# Patient Record
Sex: Male | Born: 1935 | Race: White | Hispanic: No | Smoking: Former smoker
Health system: Southern US, Community
[De-identification: ages and names within clinical notes are randomized; demographics above are authoritative.]

## PROBLEM LIST (undated history)

## (undated) DIAGNOSIS — D649 Anemia, unspecified: Secondary | ICD-10-CM

## (undated) DIAGNOSIS — R32 Unspecified urinary incontinence: Secondary | ICD-10-CM

## (undated) DIAGNOSIS — R350 Frequency of micturition: Secondary | ICD-10-CM

## (undated) DIAGNOSIS — K219 Gastro-esophageal reflux disease without esophagitis: Secondary | ICD-10-CM

## (undated) DIAGNOSIS — I10 Essential (primary) hypertension: Secondary | ICD-10-CM

## (undated) DIAGNOSIS — E785 Hyperlipidemia, unspecified: Secondary | ICD-10-CM

## (undated) DIAGNOSIS — R972 Elevated prostate specific antigen [PSA]: Secondary | ICD-10-CM

## (undated) DIAGNOSIS — I509 Heart failure, unspecified: Secondary | ICD-10-CM

## (undated) DIAGNOSIS — M1990 Unspecified osteoarthritis, unspecified site: Secondary | ICD-10-CM

## (undated) DIAGNOSIS — E119 Type 2 diabetes mellitus without complications: Secondary | ICD-10-CM

## (undated) DIAGNOSIS — N189 Chronic kidney disease, unspecified: Secondary | ICD-10-CM

## (undated) DIAGNOSIS — J449 Chronic obstructive pulmonary disease, unspecified: Secondary | ICD-10-CM

## (undated) DIAGNOSIS — R339 Retention of urine, unspecified: Secondary | ICD-10-CM

## (undated) DIAGNOSIS — K5790 Diverticulosis of intestine, part unspecified, without perforation or abscess without bleeding: Secondary | ICD-10-CM

## (undated) DIAGNOSIS — Z8719 Personal history of other diseases of the digestive system: Secondary | ICD-10-CM

## (undated) DIAGNOSIS — C801 Malignant (primary) neoplasm, unspecified: Secondary | ICD-10-CM

## (undated) HISTORY — PX: OTHER SURGICAL HISTORY: SHX169

## (undated) HISTORY — DX: Retention of urine, unspecified: R33.9

## (undated) HISTORY — DX: Frequency of micturition: R35.0

## (undated) HISTORY — PX: EYE SURGERY: SHX253

## (undated) HISTORY — DX: Unspecified urinary incontinence: R32

## (undated) HISTORY — DX: Hyperlipidemia, unspecified: E78.5

## (undated) HISTORY — PX: JOINT REPLACEMENT: SHX530

## (undated) HISTORY — PX: SEPTOPLASTY: SUR1290

## (undated) HISTORY — DX: Heart failure, unspecified: I50.9

## (undated) HISTORY — PX: HAMMER TOE SURGERY: SHX385

## (undated) HISTORY — DX: Elevated prostate specific antigen (PSA): R97.20

---

## 1970-05-17 HISTORY — PX: BACK SURGERY: SHX140

## 1978-05-17 HISTORY — PX: HERNIA REPAIR: SHX51

## 1993-05-17 HISTORY — PX: PROSTATECTOMY: SHX69

## 1998-05-17 HISTORY — PX: CHOLECYSTECTOMY: SHX55

## 2011-06-16 DIAGNOSIS — C61 Malignant neoplasm of prostate: Secondary | ICD-10-CM | POA: Diagnosis not present

## 2011-06-16 DIAGNOSIS — R972 Elevated prostate specific antigen [PSA]: Secondary | ICD-10-CM | POA: Diagnosis not present

## 2011-06-23 DIAGNOSIS — C61 Malignant neoplasm of prostate: Secondary | ICD-10-CM | POA: Diagnosis not present

## 2011-07-05 DIAGNOSIS — M779 Enthesopathy, unspecified: Secondary | ICD-10-CM | POA: Diagnosis not present

## 2011-07-29 DIAGNOSIS — M659 Synovitis and tenosynovitis, unspecified: Secondary | ICD-10-CM | POA: Diagnosis not present

## 2011-07-29 DIAGNOSIS — M79609 Pain in unspecified limb: Secondary | ICD-10-CM | POA: Diagnosis not present

## 2011-07-29 DIAGNOSIS — M205X9 Other deformities of toe(s) (acquired), unspecified foot: Secondary | ICD-10-CM | POA: Diagnosis not present

## 2011-07-29 DIAGNOSIS — M2040 Other hammer toe(s) (acquired), unspecified foot: Secondary | ICD-10-CM | POA: Diagnosis not present

## 2011-09-01 DIAGNOSIS — L989 Disorder of the skin and subcutaneous tissue, unspecified: Secondary | ICD-10-CM | POA: Diagnosis not present

## 2011-09-01 DIAGNOSIS — D485 Neoplasm of uncertain behavior of skin: Secondary | ICD-10-CM | POA: Diagnosis not present

## 2011-10-04 DIAGNOSIS — L03119 Cellulitis of unspecified part of limb: Secondary | ICD-10-CM | POA: Diagnosis not present

## 2011-10-04 DIAGNOSIS — M898X9 Other specified disorders of bone, unspecified site: Secondary | ICD-10-CM | POA: Diagnosis not present

## 2011-10-04 DIAGNOSIS — L97509 Non-pressure chronic ulcer of other part of unspecified foot with unspecified severity: Secondary | ICD-10-CM | POA: Diagnosis not present

## 2011-10-04 DIAGNOSIS — L02619 Cutaneous abscess of unspecified foot: Secondary | ICD-10-CM | POA: Diagnosis not present

## 2011-10-07 DIAGNOSIS — E7800 Pure hypercholesterolemia, unspecified: Secondary | ICD-10-CM | POA: Diagnosis not present

## 2011-10-07 DIAGNOSIS — I10 Essential (primary) hypertension: Secondary | ICD-10-CM | POA: Diagnosis not present

## 2011-10-07 DIAGNOSIS — E119 Type 2 diabetes mellitus without complications: Secondary | ICD-10-CM | POA: Diagnosis not present

## 2011-10-12 DIAGNOSIS — L97509 Non-pressure chronic ulcer of other part of unspecified foot with unspecified severity: Secondary | ICD-10-CM | POA: Diagnosis not present

## 2011-10-18 DIAGNOSIS — E119 Type 2 diabetes mellitus without complications: Secondary | ICD-10-CM | POA: Diagnosis not present

## 2011-10-18 DIAGNOSIS — I739 Peripheral vascular disease, unspecified: Secondary | ICD-10-CM | POA: Diagnosis not present

## 2011-10-18 DIAGNOSIS — E7800 Pure hypercholesterolemia, unspecified: Secondary | ICD-10-CM | POA: Diagnosis not present

## 2011-10-18 DIAGNOSIS — I10 Essential (primary) hypertension: Secondary | ICD-10-CM | POA: Diagnosis not present

## 2011-10-25 DIAGNOSIS — I774 Celiac artery compression syndrome: Secondary | ICD-10-CM | POA: Diagnosis not present

## 2011-10-25 DIAGNOSIS — I739 Peripheral vascular disease, unspecified: Secondary | ICD-10-CM | POA: Diagnosis not present

## 2011-10-28 DIAGNOSIS — I2510 Atherosclerotic heart disease of native coronary artery without angina pectoris: Secondary | ICD-10-CM | POA: Diagnosis not present

## 2011-11-02 DIAGNOSIS — M898X9 Other specified disorders of bone, unspecified site: Secondary | ICD-10-CM | POA: Diagnosis not present

## 2011-11-02 DIAGNOSIS — E119 Type 2 diabetes mellitus without complications: Secondary | ICD-10-CM | POA: Diagnosis not present

## 2011-11-02 DIAGNOSIS — I2510 Atherosclerotic heart disease of native coronary artery without angina pectoris: Secondary | ICD-10-CM | POA: Diagnosis not present

## 2011-11-05 DIAGNOSIS — E119 Type 2 diabetes mellitus without complications: Secondary | ICD-10-CM | POA: Diagnosis not present

## 2011-11-05 DIAGNOSIS — I10 Essential (primary) hypertension: Secondary | ICD-10-CM | POA: Diagnosis not present

## 2011-11-05 DIAGNOSIS — E785 Hyperlipidemia, unspecified: Secondary | ICD-10-CM | POA: Diagnosis not present

## 2011-11-05 DIAGNOSIS — M898X9 Other specified disorders of bone, unspecified site: Secondary | ICD-10-CM | POA: Diagnosis not present

## 2011-11-05 DIAGNOSIS — M2040 Other hammer toe(s) (acquired), unspecified foot: Secondary | ICD-10-CM | POA: Diagnosis not present

## 2011-11-10 DIAGNOSIS — M2040 Other hammer toe(s) (acquired), unspecified foot: Secondary | ICD-10-CM | POA: Diagnosis not present

## 2011-12-14 DIAGNOSIS — M898X9 Other specified disorders of bone, unspecified site: Secondary | ICD-10-CM | POA: Diagnosis not present

## 2011-12-21 DIAGNOSIS — I10 Essential (primary) hypertension: Secondary | ICD-10-CM | POA: Diagnosis not present

## 2011-12-21 DIAGNOSIS — I2510 Atherosclerotic heart disease of native coronary artery without angina pectoris: Secondary | ICD-10-CM | POA: Diagnosis not present

## 2012-01-04 DIAGNOSIS — C61 Malignant neoplasm of prostate: Secondary | ICD-10-CM | POA: Diagnosis not present

## 2012-01-11 DIAGNOSIS — C61 Malignant neoplasm of prostate: Secondary | ICD-10-CM | POA: Diagnosis not present

## 2012-02-18 DIAGNOSIS — Z23 Encounter for immunization: Secondary | ICD-10-CM | POA: Diagnosis not present

## 2012-03-13 DIAGNOSIS — H35379 Puckering of macula, unspecified eye: Secondary | ICD-10-CM | POA: Diagnosis not present

## 2012-03-13 DIAGNOSIS — E1139 Type 2 diabetes mellitus with other diabetic ophthalmic complication: Secondary | ICD-10-CM | POA: Diagnosis not present

## 2012-03-13 DIAGNOSIS — M479 Spondylosis, unspecified: Secondary | ICD-10-CM | POA: Diagnosis not present

## 2012-03-13 DIAGNOSIS — E119 Type 2 diabetes mellitus without complications: Secondary | ICD-10-CM | POA: Diagnosis not present

## 2012-03-13 DIAGNOSIS — E11329 Type 2 diabetes mellitus with mild nonproliferative diabetic retinopathy without macular edema: Secondary | ICD-10-CM | POA: Diagnosis not present

## 2012-03-13 DIAGNOSIS — M25559 Pain in unspecified hip: Secondary | ICD-10-CM | POA: Diagnosis not present

## 2012-04-17 DIAGNOSIS — E119 Type 2 diabetes mellitus without complications: Secondary | ICD-10-CM | POA: Diagnosis not present

## 2012-04-17 DIAGNOSIS — E7800 Pure hypercholesterolemia, unspecified: Secondary | ICD-10-CM | POA: Diagnosis not present

## 2012-04-17 DIAGNOSIS — I10 Essential (primary) hypertension: Secondary | ICD-10-CM | POA: Diagnosis not present

## 2012-04-24 DIAGNOSIS — I10 Essential (primary) hypertension: Secondary | ICD-10-CM | POA: Diagnosis not present

## 2012-04-24 DIAGNOSIS — C61 Malignant neoplasm of prostate: Secondary | ICD-10-CM | POA: Diagnosis not present

## 2012-04-24 DIAGNOSIS — E7800 Pure hypercholesterolemia, unspecified: Secondary | ICD-10-CM | POA: Diagnosis not present

## 2012-04-24 DIAGNOSIS — E119 Type 2 diabetes mellitus without complications: Secondary | ICD-10-CM | POA: Diagnosis not present

## 2012-07-04 DIAGNOSIS — C61 Malignant neoplasm of prostate: Secondary | ICD-10-CM | POA: Diagnosis not present

## 2012-07-19 DIAGNOSIS — C61 Malignant neoplasm of prostate: Secondary | ICD-10-CM | POA: Diagnosis not present

## 2012-07-31 DIAGNOSIS — K5289 Other specified noninfective gastroenteritis and colitis: Secondary | ICD-10-CM | POA: Diagnosis not present

## 2012-07-31 DIAGNOSIS — R1084 Generalized abdominal pain: Secondary | ICD-10-CM | POA: Diagnosis not present

## 2012-08-01 DIAGNOSIS — K222 Esophageal obstruction: Secondary | ICD-10-CM | POA: Diagnosis not present

## 2012-08-01 DIAGNOSIS — R1084 Generalized abdominal pain: Secondary | ICD-10-CM | POA: Diagnosis not present

## 2012-08-01 DIAGNOSIS — K449 Diaphragmatic hernia without obstruction or gangrene: Secondary | ICD-10-CM | POA: Diagnosis not present

## 2012-08-01 DIAGNOSIS — Z79899 Other long term (current) drug therapy: Secondary | ICD-10-CM | POA: Diagnosis not present

## 2012-08-01 DIAGNOSIS — R1310 Dysphagia, unspecified: Secondary | ICD-10-CM | POA: Diagnosis not present

## 2012-08-01 DIAGNOSIS — Z86010 Personal history of colonic polyps: Secondary | ICD-10-CM | POA: Diagnosis not present

## 2012-10-16 DIAGNOSIS — E119 Type 2 diabetes mellitus without complications: Secondary | ICD-10-CM | POA: Diagnosis not present

## 2012-10-16 DIAGNOSIS — I10 Essential (primary) hypertension: Secondary | ICD-10-CM | POA: Diagnosis not present

## 2012-10-16 DIAGNOSIS — E7800 Pure hypercholesterolemia, unspecified: Secondary | ICD-10-CM | POA: Diagnosis not present

## 2012-10-23 DIAGNOSIS — E7800 Pure hypercholesterolemia, unspecified: Secondary | ICD-10-CM | POA: Diagnosis not present

## 2012-10-23 DIAGNOSIS — I10 Essential (primary) hypertension: Secondary | ICD-10-CM | POA: Diagnosis not present

## 2012-10-23 DIAGNOSIS — E119 Type 2 diabetes mellitus without complications: Secondary | ICD-10-CM | POA: Diagnosis not present

## 2012-10-23 DIAGNOSIS — C61 Malignant neoplasm of prostate: Secondary | ICD-10-CM | POA: Diagnosis not present

## 2013-01-18 DIAGNOSIS — S9030XA Contusion of unspecified foot, initial encounter: Secondary | ICD-10-CM | POA: Diagnosis not present

## 2013-01-19 DIAGNOSIS — R05 Cough: Secondary | ICD-10-CM | POA: Diagnosis not present

## 2013-01-26 DIAGNOSIS — R05 Cough: Secondary | ICD-10-CM | POA: Diagnosis not present

## 2013-06-01 DIAGNOSIS — C61 Malignant neoplasm of prostate: Secondary | ICD-10-CM | POA: Diagnosis not present

## 2013-06-01 DIAGNOSIS — N058 Unspecified nephritic syndrome with other morphologic changes: Secondary | ICD-10-CM | POA: Diagnosis not present

## 2013-06-01 DIAGNOSIS — E785 Hyperlipidemia, unspecified: Secondary | ICD-10-CM | POA: Diagnosis not present

## 2013-06-01 DIAGNOSIS — E1329 Other specified diabetes mellitus with other diabetic kidney complication: Secondary | ICD-10-CM | POA: Diagnosis not present

## 2013-06-01 LAB — PSA: PSA: 15.6

## 2013-06-12 DIAGNOSIS — E782 Mixed hyperlipidemia: Secondary | ICD-10-CM | POA: Diagnosis not present

## 2013-06-12 DIAGNOSIS — I10 Essential (primary) hypertension: Secondary | ICD-10-CM | POA: Diagnosis not present

## 2013-06-18 DIAGNOSIS — Z86010 Personal history of colonic polyps: Secondary | ICD-10-CM | POA: Diagnosis not present

## 2013-06-18 DIAGNOSIS — R198 Other specified symptoms and signs involving the digestive system and abdomen: Secondary | ICD-10-CM | POA: Diagnosis not present

## 2013-06-29 ENCOUNTER — Ambulatory Visit: Payer: Self-pay | Admitting: Gastroenterology

## 2013-06-29 DIAGNOSIS — D126 Benign neoplasm of colon, unspecified: Secondary | ICD-10-CM | POA: Diagnosis not present

## 2013-06-29 DIAGNOSIS — Z860100 Personal history of colon polyps, unspecified: Secondary | ICD-10-CM | POA: Diagnosis not present

## 2013-06-29 DIAGNOSIS — D129 Benign neoplasm of anus and anal canal: Secondary | ICD-10-CM | POA: Diagnosis not present

## 2013-06-29 DIAGNOSIS — I10 Essential (primary) hypertension: Secondary | ICD-10-CM | POA: Diagnosis not present

## 2013-06-29 DIAGNOSIS — Z79899 Other long term (current) drug therapy: Secondary | ICD-10-CM | POA: Diagnosis not present

## 2013-06-29 DIAGNOSIS — Z9089 Acquired absence of other organs: Secondary | ICD-10-CM | POA: Diagnosis not present

## 2013-06-29 DIAGNOSIS — C61 Malignant neoplasm of prostate: Secondary | ICD-10-CM | POA: Diagnosis not present

## 2013-06-29 DIAGNOSIS — D128 Benign neoplasm of rectum: Secondary | ICD-10-CM | POA: Diagnosis not present

## 2013-06-29 DIAGNOSIS — K5730 Diverticulosis of large intestine without perforation or abscess without bleeding: Secondary | ICD-10-CM | POA: Diagnosis not present

## 2013-06-29 DIAGNOSIS — R634 Abnormal weight loss: Secondary | ICD-10-CM | POA: Diagnosis not present

## 2013-06-29 DIAGNOSIS — E119 Type 2 diabetes mellitus without complications: Secondary | ICD-10-CM | POA: Diagnosis not present

## 2013-06-29 DIAGNOSIS — Z96659 Presence of unspecified artificial knee joint: Secondary | ICD-10-CM | POA: Diagnosis not present

## 2013-06-29 DIAGNOSIS — E785 Hyperlipidemia, unspecified: Secondary | ICD-10-CM | POA: Diagnosis not present

## 2013-06-29 DIAGNOSIS — Z87891 Personal history of nicotine dependence: Secondary | ICD-10-CM | POA: Diagnosis not present

## 2013-07-03 LAB — PATHOLOGY REPORT

## 2013-07-17 DIAGNOSIS — C61 Malignant neoplasm of prostate: Secondary | ICD-10-CM | POA: Diagnosis not present

## 2013-07-17 DIAGNOSIS — R339 Retention of urine, unspecified: Secondary | ICD-10-CM | POA: Diagnosis not present

## 2013-07-25 DIAGNOSIS — Z86010 Personal history of colonic polyps: Secondary | ICD-10-CM | POA: Diagnosis not present

## 2013-07-25 DIAGNOSIS — R198 Other specified symptoms and signs involving the digestive system and abdomen: Secondary | ICD-10-CM | POA: Diagnosis not present

## 2013-08-16 DIAGNOSIS — C61 Malignant neoplasm of prostate: Secondary | ICD-10-CM | POA: Diagnosis not present

## 2013-08-16 DIAGNOSIS — R972 Elevated prostate specific antigen [PSA]: Secondary | ICD-10-CM | POA: Diagnosis not present

## 2013-08-16 DIAGNOSIS — R350 Frequency of micturition: Secondary | ICD-10-CM | POA: Diagnosis not present

## 2013-08-22 ENCOUNTER — Ambulatory Visit: Payer: Self-pay | Admitting: Urology

## 2013-08-22 DIAGNOSIS — R948 Abnormal results of function studies of other organs and systems: Secondary | ICD-10-CM | POA: Diagnosis not present

## 2013-08-22 DIAGNOSIS — C61 Malignant neoplasm of prostate: Secondary | ICD-10-CM | POA: Diagnosis not present

## 2013-08-23 DIAGNOSIS — R350 Frequency of micturition: Secondary | ICD-10-CM | POA: Diagnosis not present

## 2013-08-23 DIAGNOSIS — C61 Malignant neoplasm of prostate: Secondary | ICD-10-CM | POA: Diagnosis not present

## 2013-09-17 DIAGNOSIS — E785 Hyperlipidemia, unspecified: Secondary | ICD-10-CM | POA: Insufficient documentation

## 2013-09-17 DIAGNOSIS — E119 Type 2 diabetes mellitus without complications: Secondary | ICD-10-CM | POA: Insufficient documentation

## 2013-09-17 DIAGNOSIS — E7849 Other hyperlipidemia: Secondary | ICD-10-CM | POA: Insufficient documentation

## 2013-09-17 DIAGNOSIS — R9389 Abnormal findings on diagnostic imaging of other specified body structures: Secondary | ICD-10-CM | POA: Insufficient documentation

## 2013-09-17 DIAGNOSIS — I10 Essential (primary) hypertension: Secondary | ICD-10-CM | POA: Insufficient documentation

## 2013-09-20 DIAGNOSIS — C61 Malignant neoplasm of prostate: Secondary | ICD-10-CM | POA: Diagnosis not present

## 2013-10-18 DIAGNOSIS — R350 Frequency of micturition: Secondary | ICD-10-CM | POA: Diagnosis not present

## 2013-10-18 DIAGNOSIS — C61 Malignant neoplasm of prostate: Secondary | ICD-10-CM | POA: Diagnosis not present

## 2013-11-15 DIAGNOSIS — C61 Malignant neoplasm of prostate: Secondary | ICD-10-CM | POA: Diagnosis not present

## 2013-11-29 DIAGNOSIS — C61 Malignant neoplasm of prostate: Secondary | ICD-10-CM | POA: Diagnosis not present

## 2013-11-29 DIAGNOSIS — R339 Retention of urine, unspecified: Secondary | ICD-10-CM | POA: Diagnosis not present

## 2013-12-17 DIAGNOSIS — R32 Unspecified urinary incontinence: Secondary | ICD-10-CM | POA: Diagnosis not present

## 2013-12-17 DIAGNOSIS — C61 Malignant neoplasm of prostate: Secondary | ICD-10-CM | POA: Diagnosis not present

## 2014-01-07 DIAGNOSIS — K5732 Diverticulitis of large intestine without perforation or abscess without bleeding: Secondary | ICD-10-CM | POA: Diagnosis not present

## 2014-01-07 DIAGNOSIS — N058 Unspecified nephritic syndrome with other morphologic changes: Secondary | ICD-10-CM | POA: Diagnosis not present

## 2014-01-07 DIAGNOSIS — E785 Hyperlipidemia, unspecified: Secondary | ICD-10-CM | POA: Diagnosis not present

## 2014-01-07 DIAGNOSIS — E1329 Other specified diabetes mellitus with other diabetic kidney complication: Secondary | ICD-10-CM | POA: Diagnosis not present

## 2014-01-09 ENCOUNTER — Inpatient Hospital Stay: Payer: Self-pay | Admitting: Internal Medicine

## 2014-01-09 DIAGNOSIS — I10 Essential (primary) hypertension: Secondary | ICD-10-CM | POA: Diagnosis not present

## 2014-01-09 DIAGNOSIS — R5381 Other malaise: Secondary | ICD-10-CM | POA: Diagnosis not present

## 2014-01-09 DIAGNOSIS — K2970 Gastritis, unspecified, without bleeding: Secondary | ICD-10-CM | POA: Diagnosis present

## 2014-01-09 DIAGNOSIS — K921 Melena: Secondary | ICD-10-CM | POA: Diagnosis not present

## 2014-01-09 DIAGNOSIS — I129 Hypertensive chronic kidney disease with stage 1 through stage 4 chronic kidney disease, or unspecified chronic kidney disease: Secondary | ICD-10-CM | POA: Diagnosis present

## 2014-01-09 DIAGNOSIS — K2940 Chronic atrophic gastritis without bleeding: Secondary | ICD-10-CM | POA: Diagnosis not present

## 2014-01-09 DIAGNOSIS — E119 Type 2 diabetes mellitus without complications: Secondary | ICD-10-CM | POA: Diagnosis not present

## 2014-01-09 DIAGNOSIS — Z8546 Personal history of malignant neoplasm of prostate: Secondary | ICD-10-CM | POA: Diagnosis not present

## 2014-01-09 DIAGNOSIS — Z96659 Presence of unspecified artificial knee joint: Secondary | ICD-10-CM | POA: Diagnosis not present

## 2014-01-09 DIAGNOSIS — R5383 Other fatigue: Secondary | ICD-10-CM | POA: Diagnosis not present

## 2014-01-09 DIAGNOSIS — Z86010 Personal history of colonic polyps: Secondary | ICD-10-CM | POA: Diagnosis not present

## 2014-01-09 DIAGNOSIS — Z23 Encounter for immunization: Secondary | ICD-10-CM | POA: Diagnosis not present

## 2014-01-09 DIAGNOSIS — N179 Acute kidney failure, unspecified: Secondary | ICD-10-CM | POA: Diagnosis not present

## 2014-01-09 DIAGNOSIS — N189 Chronic kidney disease, unspecified: Secondary | ICD-10-CM | POA: Diagnosis present

## 2014-01-09 DIAGNOSIS — D62 Acute posthemorrhagic anemia: Secondary | ICD-10-CM | POA: Diagnosis not present

## 2014-01-09 DIAGNOSIS — R197 Diarrhea, unspecified: Secondary | ICD-10-CM | POA: Diagnosis not present

## 2014-01-09 DIAGNOSIS — K269 Duodenal ulcer, unspecified as acute or chronic, without hemorrhage or perforation: Secondary | ICD-10-CM | POA: Diagnosis not present

## 2014-01-09 DIAGNOSIS — K449 Diaphragmatic hernia without obstruction or gangrene: Secondary | ICD-10-CM | POA: Diagnosis not present

## 2014-01-09 DIAGNOSIS — D649 Anemia, unspecified: Secondary | ICD-10-CM | POA: Diagnosis not present

## 2014-01-09 DIAGNOSIS — K2990 Gastroduodenitis, unspecified, without bleeding: Secondary | ICD-10-CM | POA: Diagnosis not present

## 2014-01-09 DIAGNOSIS — K2270 Barrett's esophagus without dysplasia: Secondary | ICD-10-CM | POA: Diagnosis not present

## 2014-01-09 LAB — CBC WITH DIFFERENTIAL/PLATELET
Basophil #: 0 10*3/uL (ref 0.0–0.1)
Basophil %: 0.6 %
EOS ABS: 0.2 10*3/uL (ref 0.0–0.7)
EOS PCT: 4.5 %
HCT: 30 % — ABNORMAL LOW (ref 40.0–52.0)
HGB: 10.3 g/dL — ABNORMAL LOW (ref 13.0–18.0)
Lymphocyte #: 1.1 10*3/uL (ref 1.0–3.6)
Lymphocyte %: 21.2 %
MCH: 33.1 pg (ref 26.0–34.0)
MCHC: 34.4 g/dL (ref 32.0–36.0)
MCV: 96 fL (ref 80–100)
Monocyte #: 0.5 x10 3/mm (ref 0.2–1.0)
Monocyte %: 9.4 %
NEUTROS ABS: 3.5 10*3/uL (ref 1.4–6.5)
NEUTROS PCT: 64.3 %
Platelet: 202 10*3/uL (ref 150–440)
RBC: 3.12 10*6/uL — AB (ref 4.40–5.90)
RDW: 13.7 % (ref 11.5–14.5)
WBC: 5.4 10*3/uL (ref 3.8–10.6)

## 2014-01-09 LAB — COMPREHENSIVE METABOLIC PANEL
ALBUMIN: 3.4 g/dL (ref 3.4–5.0)
ALK PHOS: 65 U/L
ALT: 18 U/L
ANION GAP: 7 (ref 7–16)
AST: 26 U/L (ref 15–37)
BUN: 46 mg/dL — AB (ref 7–18)
Bilirubin,Total: 0.2 mg/dL (ref 0.2–1.0)
CREATININE: 2.15 mg/dL — AB (ref 0.60–1.30)
Calcium, Total: 8.9 mg/dL (ref 8.5–10.1)
Chloride: 110 mmol/L — ABNORMAL HIGH (ref 98–107)
Co2: 24 mmol/L (ref 21–32)
EGFR (African American): 32 — ABNORMAL LOW
EGFR (Non-African Amer.): 28 — ABNORMAL LOW
GLUCOSE: 115 mg/dL — AB (ref 65–99)
OSMOLALITY: 294 (ref 275–301)
Potassium: 4.6 mmol/L (ref 3.5–5.1)
Sodium: 141 mmol/L (ref 136–145)
TOTAL PROTEIN: 6.9 g/dL (ref 6.4–8.2)

## 2014-01-09 LAB — TROPONIN I: Troponin-I: <0.02 ng/mL

## 2014-01-10 LAB — URINALYSIS, COMPLETE
BILIRUBIN, UR: NEGATIVE
Bacteria: NONE SEEN
Blood: NEGATIVE
Glucose,UR: NEGATIVE mg/dL (ref 0–75)
KETONE: NEGATIVE
Leukocyte Esterase: NEGATIVE
Nitrite: NEGATIVE
PH: 5 (ref 4.5–8.0)
PROTEIN: NEGATIVE
SPECIFIC GRAVITY: 1.008 (ref 1.003–1.030)
SQUAMOUS EPITHELIAL: NONE SEEN
WBC UR: NONE SEEN /HPF (ref 0–5)

## 2014-01-10 LAB — OCCULT BLOOD X 1 CARD TO LAB, STOOL: OCCULT BLOOD, FECES: NEGATIVE

## 2014-01-10 LAB — BASIC METABOLIC PANEL
ANION GAP: 8 (ref 7–16)
BUN: 38 mg/dL — AB (ref 7–18)
CALCIUM: 8.3 mg/dL — AB (ref 8.5–10.1)
CO2: 21 mmol/L (ref 21–32)
Chloride: 114 mmol/L — ABNORMAL HIGH (ref 98–107)
Creatinine: 1.86 mg/dL — ABNORMAL HIGH (ref 0.60–1.30)
EGFR (Non-African Amer.): 33 — ABNORMAL LOW
GFR CALC AF AMER: 38 — AB
GLUCOSE: 70 mg/dL (ref 65–99)
OSMOLALITY: 292 (ref 275–301)
Potassium: 3.7 mmol/L (ref 3.5–5.1)
Sodium: 143 mmol/L (ref 136–145)

## 2014-01-10 LAB — MAGNESIUM: Magnesium: 2 mg/dL

## 2014-01-10 LAB — HEMOGLOBIN: HGB: 9.1 g/dL — AB (ref 13.0–18.0)

## 2014-01-10 LAB — HEMOGLOBIN A1C: Hemoglobin A1C: 6.1 % (ref 4.2–6.3)

## 2014-01-10 LAB — TSH: Thyroid Stimulating Horm: 1.97 u[IU]/mL

## 2014-01-10 LAB — CLOSTRIDIUM DIFFICILE(ARMC)

## 2014-01-11 LAB — BASIC METABOLIC PANEL
ANION GAP: 8 (ref 7–16)
BUN: 33 mg/dL — AB (ref 7–18)
CHLORIDE: 112 mmol/L — AB (ref 98–107)
CO2: 22 mmol/L (ref 21–32)
Calcium, Total: 8.4 mg/dL — ABNORMAL LOW (ref 8.5–10.1)
Creatinine: 1.5 mg/dL — ABNORMAL HIGH (ref 0.60–1.30)
EGFR (African American): 50 — ABNORMAL LOW
GFR CALC NON AF AMER: 43 — AB
GLUCOSE: 104 mg/dL — AB (ref 65–99)
OSMOLALITY: 291 (ref 275–301)
POTASSIUM: 4.2 mmol/L (ref 3.5–5.1)
SODIUM: 142 mmol/L (ref 136–145)

## 2014-01-11 LAB — HEMOGLOBIN: HGB: 9.1 g/dL — AB (ref 13.0–18.0)

## 2014-01-11 LAB — RETICULOCYTES
Absolute Retic Count: 0.032 10*6/uL (ref 0.019–0.186)
Reticulocyte: 1.1 % (ref 0.4–3.1)

## 2014-01-11 LAB — IRON AND TIBC
IRON BIND. CAP.(TOTAL): 192 ug/dL — AB (ref 250–450)
IRON: 59 ug/dL — AB (ref 65–175)
Iron Saturation: 31 %
Unbound Iron-Bind.Cap.: 133 ug/dL

## 2014-01-11 LAB — FERRITIN: FERRITIN (ARMC): 135 ng/mL (ref 8–388)

## 2014-01-11 LAB — WBCS, STOOL

## 2014-01-12 LAB — STOOL CULTURE

## 2014-01-17 DIAGNOSIS — N183 Chronic kidney disease, stage 3 unspecified: Secondary | ICD-10-CM | POA: Diagnosis not present

## 2014-01-19 LAB — PATHOLOGY REPORT

## 2014-01-22 DIAGNOSIS — G43809 Other migraine, not intractable, without status migrainosus: Secondary | ICD-10-CM | POA: Diagnosis not present

## 2014-01-22 DIAGNOSIS — N329 Bladder disorder, unspecified: Secondary | ICD-10-CM | POA: Diagnosis not present

## 2014-01-22 DIAGNOSIS — N183 Chronic kidney disease, stage 3 unspecified: Secondary | ICD-10-CM | POA: Diagnosis not present

## 2014-01-28 DIAGNOSIS — N289 Disorder of kidney and ureter, unspecified: Secondary | ICD-10-CM | POA: Diagnosis not present

## 2014-02-05 DIAGNOSIS — Z23 Encounter for immunization: Secondary | ICD-10-CM | POA: Diagnosis not present

## 2014-04-23 DIAGNOSIS — N184 Chronic kidney disease, stage 4 (severe): Secondary | ICD-10-CM | POA: Diagnosis not present

## 2014-04-25 DIAGNOSIS — N183 Chronic kidney disease, stage 3 (moderate): Secondary | ICD-10-CM | POA: Diagnosis not present

## 2014-04-30 DIAGNOSIS — H6123 Impacted cerumen, bilateral: Secondary | ICD-10-CM | POA: Diagnosis not present

## 2014-04-30 DIAGNOSIS — H903 Sensorineural hearing loss, bilateral: Secondary | ICD-10-CM | POA: Diagnosis not present

## 2014-04-30 DIAGNOSIS — H9071 Mixed conductive and sensorineural hearing loss, unilateral, right ear, with unrestricted hearing on the contralateral side: Secondary | ICD-10-CM | POA: Diagnosis not present

## 2014-05-29 DIAGNOSIS — Z09 Encounter for follow-up examination after completed treatment for conditions other than malignant neoplasm: Secondary | ICD-10-CM | POA: Diagnosis not present

## 2014-05-29 DIAGNOSIS — C61 Malignant neoplasm of prostate: Secondary | ICD-10-CM | POA: Diagnosis not present

## 2014-05-29 DIAGNOSIS — D692 Other nonthrombocytopenic purpura: Secondary | ICD-10-CM | POA: Diagnosis not present

## 2014-05-29 DIAGNOSIS — N184 Chronic kidney disease, stage 4 (severe): Secondary | ICD-10-CM | POA: Diagnosis not present

## 2014-05-29 DIAGNOSIS — I2510 Atherosclerotic heart disease of native coronary artery without angina pectoris: Secondary | ICD-10-CM | POA: Diagnosis not present

## 2014-05-29 DIAGNOSIS — E1129 Type 2 diabetes mellitus with other diabetic kidney complication: Secondary | ICD-10-CM | POA: Diagnosis not present

## 2014-05-29 DIAGNOSIS — I10 Essential (primary) hypertension: Secondary | ICD-10-CM | POA: Diagnosis not present

## 2014-05-29 DIAGNOSIS — E782 Mixed hyperlipidemia: Secondary | ICD-10-CM | POA: Diagnosis not present

## 2014-05-29 LAB — BASIC METABOLIC PANEL
BUN: 39 mg/dL — AB (ref 4–21)
Creatinine: 1.7 mg/dL — AB (ref <=1.3)

## 2014-05-29 LAB — LIPID PANEL
Cholesterol: 148 mg/dL (ref 0–200)
HDL: 59 mg/dL (ref 35–70)
LDL Cholesterol: 71 mg/dL
Triglycerides: 91 mg/dL (ref 40–160)

## 2014-09-07 NOTE — Consult Note (Signed)
PATIENT NAME:  Shawn Mendez, Shawn Mendez MR#:  [id number] DATE OF BIRTH:  [birth year]  DATE OF CONSULTATION:  01/10/2014  REFERRING PHYSICIAN:  Dr. Leslye Peer CONSULTING PHYSICIAN:  Janalyn Harder. Jerelene Redden, ANP (Adult Nurse Practitioner), [name], MD   REASON FOR CONSULTATION: Black stool.   HISTORY OF PRESENT ILLNESS: This is an 81 year old patient of Dr. [name], reports he was in good health until 12/27/2013, when he developed acute onset of watery diarrhea. He had buried his son on Thursday and by Sunday night he had been eating out at a restaurant and woke up that night with severe diarrhea. This was watery diarrhea without any abdominal cramps, nausea, vomiting, fevers or chills. He says he felt absolutely fine except that he was having 3 diarrhea stools during the day and waking up at night with 1 or 2 diarrheas. This went on for several weeks before he saw Dr. Army Melia. She prescribed Cipro and Flagyl and that seemed to improve the diarrhea after about 3 doses. Laboratory studies were performed and it did show a worsening renal function and slight worsening anemia. Hemoglobin baseline has been 11 for many years and a hemoglobin was 10.3. The patient had been seeing black stools. He was feeling increasingly weak so he presented to the Emergency Room with a concern over possible upper GI bleed.   Currently, he has had 2 formed stools today. He still has no complaints of nausea, vomiting, abdominal pain, or cramps. He says he feels good. Laboratory studies with hemoglobin 9.1. Hemoccult is negative. Renal function with hydration has improved from a BUN of 46 to 38, creatinine 2.15 to 1.86. The patient is hoping to go home soon.   He does have a personal history of adenomatous colon polyps and very recently underwent colonoscopy by Dr. Gustavo Lah 06/29/2013, notable for large adenomatous polyps. He reports an upper endoscopy performed 2014 in [place] where he resided and reports it was negative. he cannot  recall why it was done, perhaps for anemia. The patient has been anemic for  many decades and said he used to have to take iron. He is not a meat eater. He attributes his anemia due to not eating meat.   PAST MEDICAL HISTORY: 1.  Hypertension.  2.  Hyperlipidemia.  3.  Diabetes mellitus. The patient reports recent A1c of 6.1, on half dose Amaryl.  4.  History of elevated coronary artery calcium score.   PAST SURGICAL HISTORY: 1.  Back surgery x 2.  2.  Inguinal hernia repair.  3.  Prostate surgery in 1995.  4.  Cholecystectomy.  5.  Right total knee replacement in 2001.  6.  Tympanoplasty.  7.  Foot surgery x 2.   MEDICATIONS ON ADMISSION: 1.  Losartan/hydrochlorothiazide 100/25 mg 1 tablet once daily.  2.  Glimepiride 1 mg 1/2 tablet by mouth daily.  3.  Pravastatin 20 mg 1 daily.  4.  Vitamin D3 at 2000 units daily.  5.  B12.  6.  MiraLax powder.   ALLERGIES: NKDA.   HABITS: Negative tobacco or alcohol use. Previous heavy alcohol use, discontinued in 1971.  Previous history of tobacco use, discontinued in the early 1980s.   FAMILY HISTORY: Positive for sisters with breast cancer, father with heart disease at 51. Mother deceased almost 31 with cardiac problems. Grandmother with heart failure. Grandparents with anemia.   SOCIAL HISTORY: The patient is widowed, lives alone.   REVIEW OF SYSTEMS: Ten systems reviewed and negative, with the exception of history of present illness. The  patient reports stable weight of 165.   PHYSICAL EXAMINATION: VITAL SIGNS: 98.1, 54, 20, 124/64, 96% on room air.  GENERAL: Elderly Caucasian male sitting at the side of the bed, NAD.  HEENT: Head is normocephalic. Conjunctivae pink. Sclerae anicteric.  NECK: Supple, positive 2 hard lymph nodes noted anterior cervical bilateral. Trachea is midline. Thyroid is normal. No lymphadenopathy elsewhere. These are symmetrical nodules, may be an anatomic variant for him.  CARDIAC: S1, S2 without murmur or  gallop.  LUNGS: CTA. Respirations are eupneic.  ABDOMEN: Soft, flat, nontender in all quadrants without HSM or masses.  RECTAL: Deferred.  SKIN: Warm and dry without rash or edema.  MUSCULOSKELETAL: No joint swelling, inflammation. The patient is ambulatory.  NEUROLOGIC: Cranial nerves II through XII grossly intact. Good memory. Speech is clear.   LABORATORY DATA: Today with hemoglobin 9.1. Clostridium difficile negative stool for comprehensive culture. Stool negative for Campylobacter. Guaiac-negative stool.   IMAGING: Ultrasound of the kidneys bilateral, normal.   IMPRESSION: The patient presents with black stool, very slight decrease in baseline chronic anemia. Hemoccult negative. Recent colonoscopy done earlier this year. The patient reports a negative upper endoscopy done 2014 in [place]. He offers no GI complaints currently. He was experiencing acute episode of diarrhea 12/27/2013 while traveling. He was given a few days of Cipro and Flagyl and reports bowel habits are now starting to firm up. Etiology for his diarrhea is most likely from infectious source. A patient with diabetes and history of cholecystectomy may have a more overwhelming diarrhea event following an infection. He could be developing a  new onset colitis as possible cause. He seems to be getting better on current therapy with IV Cipro and Flagyl and would complete the course. Because of history of chronic normocytic anemia, I will add iron panel, reticulocyte count, as well as check his B12. He presents on oral B12 supplementation. He certainly could have anemia from chronic illness in the setting of renal insufficiency and DM.    This case was discussed with Dr. Vira Agar in collaboration of care. Further GI recommendations pending laboratory studies and clinical course. Thank you for the consultation.   This services provided by Joelene Millin A. Jerelene Redden, MS, APRN, BC, ANP under collaborative agreement with Dr. Gaylyn Cheers.     ____________________________ Janalyn Harder. Jerelene Redden, ANP (Adult Nurse Practitioner) kam:at D: 01/10/2014 16:50:10 ET T: 01/10/2014 17:22:04 ET JOB#: [id number]  cc: Joelene Millin A. Jerelene Redden, ANP (Adult Nurse Practitioner), <Dictator> Janalyn Harder Sherlyn Hay, MSN, ANP-BC Adult Nurse Practitioner ELECTRONICALLY SIGNED 01/11/2014 12:30

## 2014-09-07 NOTE — Consult Note (Signed)
Due to anemia and dark stools will do EGD tomorrow.  It is possible the black stools were due to iron or Flagyl but would like to know for sure given his anemia.  Electronic Signatures: [name] (MD)  (Signed on 27-Aug-15 18:34)  Authored  Last Updated: 27-Aug-15 18:34 by [name] (MD)

## 2014-09-07 NOTE — Consult Note (Signed)
Linear superficial ulcer in duodenal bulb, focal gastritis in greater curve in body of stomach, possible short segment Barretts.  No blood, no bleeding. Can go home today if has people to look after him (anesthesia).  Electronic Signatures: [name] (MD)  (Signed on 28-Aug-15 12:08)  Authored  Last Updated: 28-Aug-15 12:08 by [name] (MD)

## 2014-09-07 NOTE — Discharge Summary (Signed)
PATIENT NAME:  Shawn Mendez, Shawn Mendez MR#:  [id number] DATE OF BIRTH:  [birth year]  DATE OF ADMISSION:  01/09/2014 DATE OF DISCHARGE:  01/11/2014  PRIMARY CARE PHYSICIAN:  [name], MD     FINAL DIAGNOSES: 1.  Acute renal failure on chronic kidney disease.  2.  Acute anemia.  3.  Diarrhea, the patient was found to have Barrett's esophagus and duodenal ulcer on EGD, and patient did have dark stool with the diarrhea.  4.  Hypertension.  5.  Diabetes.   MEDICATIONS ON DISCHARGE:  Include glipizide half tablet once a day, pravastatin 20 mg at bedtime, hydrochlorothiazide/losartan 25/100 one tablet daily, vitamin B12 of 500 mcg daily, FiberCon 1 tablet daily, metronidazole 500 mg 3 times a day, finish course; Cipro 500 mg every 12 hours, finish course; omeprazole 20 mg twice a day.   DIET:  Low sodium, carbohydrate-controlled diet, regular consistency.   ACTIVITY:  As tolerated.   FOLLOW-UP:  With [name], MD if needed; and in 1 to 2 weeks with Dr. [name].   HOSPITAL COURSE:  The patient was admitted 01/09/2014, and discharged 01/11/2014. Came in with diarrhea for 10 days, dizziness and weakness; was admitted with acute renal failure, started on IV fluid hydration.   LABORATORY AND RADIOLOGICAL DATA DURING THE HOSPITAL COURSE: Included troponin negative, white blood cell count 5.4, H and H 10.3 and 30.0; platelet count of 202,000, glucose 115, BUN 46, creatinine 2.15, sodium 141, potassium 4.6, chloride 110, CO2 of 24, calcium 8.9, liver function tests normal range; EKG showed sinus bradycardia; urinalysis negative, hemoglobin 9.1, after hydration, hemoglobin A1c 6.1, TSH 1.97, stool culture, no Salmonella or campylobacter, stool for C. difficile was negative, ultrasound of the kidney no evidence of renal mass or hydronephrosis; iron studies showed a ferritin of 135, reticulocyte count 1.1, TIBC 192, iron serum 59, repeat hemoglobin 9.1, repeat creatinine 1.50, with a GFR of 43.    HOSPITAL COURSE PER PROBLEM LIST:  1.  For the patient's acute renal failure on chronic kidney disease, the patient was given IV fluid hydration; creatinine had improved, almost to baseline; the patient's baseline GFR is 54, stable for discharge home.  2.  Anemia, worsened here in the hospital with IV fluid hydration and dilution. The patient was guaiac-negative on test that was sent to the lab; EGD was done that showed Barrett esophagus and duodenal ulcer. The patient stated that he had diarrhea of black stool, occasionally does take an aspirin, I advised him to stop that and I prescribed omeprazole 20 mg b.i.d.  3.  Hypertension. Blood pressure is stable.  4.  Diabetes. Can restart glipizide as outpatient.   TIME SPENT ON DISCHARGE:  Was 35 minutes.    ____________________________ Tana Conch. Leslye Peer, MD rjw:nt D: 01/11/2014 13:12:54 ET T: 01/11/2014 21:19:32 ET JOB#: [id number]  cc: Tana Conch. Leslye Peer, MD, <Dictator> [name], MD Marisue Brooklyn MD ELECTRONICALLY SIGNED 01/18/2014 15:08

## 2014-09-07 NOTE — H&P (Signed)
PATIENT NAME:  Shawn Mendez, Shawn Mendez MR#:  [id number] DATE OF BIRTH:  1930-11-11  DATE OF ADMISSION:  01/09/2014  PRIMARY CARE PHYSICIAN:  [name], MD   REFERRING PHYSICIAN:  Archie Balboa, MD   CHIEF COMPLAINTS:  Diarrhea for 10 days, dizziness, and weakness for 2 days.   HISTORY OF PRESENT ILLNESS:  An 81 year old Caucasian male with a history of atherosclerosis, hypercholesterolemia, hypertension, prostate cancer, presented to the ED with the above chief complaint.  The patient is alert, awake, oriented, in no acute distress. The patient has had watery diarrhea for the past 10 days. The patient saw his primary physician, and was given antibiotics twice a day for the past 2 days. The patient's diarrhea resolved, he only has 1 episode of bowel movement today. The patient noticed he has black stool. For the past 2 days, patient feels dizzy, and weak, so patient comes to the ED for further evaluation. The patient was noted to have acute renal failure, was treated with IV fluid support.   PAST MEDICAL HISTORY:  As mentioned above, hypertension, hypercholesterolemia, atherosclerosis, prostate cancer.   PAST SURGICAL HISTORY:  Cholecystectomy, prostatectomy, foot surgery, back surgery, hernia repair, right knee replacement.   SOCIAL HISTORY:  No smoking or drinking or illicit drugs.   FAMILY HISTORY:  Hypertension.   ALLERGIES:  None.   HOME MEDICATIONS:  Pravastatin 20 mg p.o. at bedtime, hydrochlorothiazide/losartan 25 mg/100 mg p.o. tablet 1 tablet once a day, Glipizide 0.5 tablets once a day.   REVIEW OF SYSTEMS:  CONSTITUTIONAL:  The patient denies any fever or chills. No headache but has dizziness, and generalized weakness.  EYES:  No double vision, blurry vision.  ENT:  No postnasal drip, slurred speech, or dysphagia.  CARDIOVASCULAR:  No chest pain, palpitation, orthopnea, nocturnal dyspnea. No leg edema.  PULMONARY:  No cough, sputum, shortness of breath, or hemoptysis.   GASTROINTESTINAL:  No abdominal pain, nausea, vomiting, but has watery diarrhea, and melena, no bloody stool.  GENITOURINARY:  No dysuria, hematuria, or incontinence.  SKIN: No rash, or jaundice.  NEUROLOGIC:  No syncope, loss of consciousness, neither seizure.  ENDOCRINOLOGY:  No polyuria, polydipsia, heat or cold intolerance.  HEMATOLOGIC:  No easy bruising, or bleeding.   VITAL SIGNS:  Temperature 98.9, blood pressure 156/76, pulse 58, oxygen saturation 100% on room air.   PHYSICAL EXAMINATION:  GENERAL:  The patient is alert, awake, oriented, in no acute distress.  HEENT:  Pupils round, equal and reactive to light, and accommodation.  NECK:  Supple. No JVD or carotid bruit. No lymphadenopathy. No thyromegaly.  CARDIOVASCULAR:  S1, S2 regular rate, rhythm. No murmurs, or gallops.  PULMONARY:  Bilateral air entry. No wheezing or rales. No use of accessory muscles to breathe.  ABDOMEN:  Soft. No distention or tenderness. No organomegaly. Bowel sounds present.  EXTREMITIES:  No edema, clubbing, or cyanosis. No calf tenderness. Bilateral pedal pulses present.  SKIN:  No rash or jaundice.  NEUROLOGIC:  A and O x 3, no focal deficit, power 5/5, sensation intact.   LABORATORY DATA:  Glucose 115, BUN 46, creatinine 2.15, sodium 141, potassium 4.6, chloride 110, bicarbonate 24, WBC 5.4, hemoglobin 10.3, platelets 96,000, troponin less than 0.02, EKG showed sinus bradycardia at 56 BPM.   IMPRESSIONS: 1.  Acute renal failure.  2.  Hypertension.  3.  Diarrhea, need to rule out of Clostridium difficile colitis.  4.  Hypertension.  5.  Hyperlipidemia.  6.  History of prostate cancer.  7.  Anemia.   PLAN  OF TREATMENT:  1.  This patient will be admitted to the medical floor. We will hold hydrochlorothiazide/losartan, and give normal saline IV and follow up BMP, in addition, we will get a kidney ultrasound to rule out obstruction.  2.  For hypertension, we will hold hydrochlorothiazide/losartan,  give hydralazine 25 mg every 8 hours.  3.  For diabetes, we will start sliding scale but hold glycoside, check hemoglobin A1c.  4.  Continual pravastatin.  5.  PT consult.  6.  For, diarrhea we will check a stool C. difficile test, and stool culture, start Cipro, and Flagyl.   I discussed the patient's condition, and plan of treatment with the patient, and the patient's son.   CODE STATUS:  The patient wants full code.   TIME SPENT:  About 34 minutes.    ____________________________ Demetrios Loll, MD qc:nt D: 01/09/2014 22:05:43 ET T: 01/09/2014 22:45:47 ET JOB#: [id number]  cc: Demetrios Loll, MD, <Dictator> Demetrios Loll MD ELECTRONICALLY SIGNED 01/11/2014 12:52

## 2014-10-22 ENCOUNTER — Encounter
Admission: RE | Admit: 2014-10-22 | Discharge: 2014-10-22 | Disposition: A | Discharge status: Home / Self Care | Payer: Medicare Other | Source: Ambulatory Visit | Attending: Unknown Physician Specialty | Admitting: Unknown Physician Specialty

## 2014-10-22 DIAGNOSIS — Z86010 Personal history of colonic polyps: Secondary | ICD-10-CM | POA: Diagnosis not present

## 2014-10-22 DIAGNOSIS — E119 Type 2 diabetes mellitus without complications: Secondary | ICD-10-CM | POA: Diagnosis not present

## 2014-10-22 DIAGNOSIS — Z87891 Personal history of nicotine dependence: Secondary | ICD-10-CM | POA: Diagnosis not present

## 2014-10-22 DIAGNOSIS — K449 Diaphragmatic hernia without obstruction or gangrene: Secondary | ICD-10-CM | POA: Diagnosis not present

## 2014-10-22 DIAGNOSIS — J449 Chronic obstructive pulmonary disease, unspecified: Secondary | ICD-10-CM | POA: Diagnosis not present

## 2014-10-22 DIAGNOSIS — G5602 Carpal tunnel syndrome, left upper limb: Secondary | ICD-10-CM | POA: Diagnosis not present

## 2014-10-22 DIAGNOSIS — E785 Hyperlipidemia, unspecified: Secondary | ICD-10-CM | POA: Diagnosis not present

## 2014-10-22 DIAGNOSIS — G5601 Carpal tunnel syndrome, right upper limb: Secondary | ICD-10-CM | POA: Diagnosis present

## 2014-10-22 DIAGNOSIS — Z8249 Family history of ischemic heart disease and other diseases of the circulatory system: Secondary | ICD-10-CM | POA: Diagnosis not present

## 2014-10-22 DIAGNOSIS — Z79899 Other long term (current) drug therapy: Secondary | ICD-10-CM | POA: Diagnosis not present

## 2014-10-22 DIAGNOSIS — I10 Essential (primary) hypertension: Secondary | ICD-10-CM | POA: Diagnosis not present

## 2014-10-22 DIAGNOSIS — K219 Gastro-esophageal reflux disease without esophagitis: Secondary | ICD-10-CM | POA: Diagnosis not present

## 2014-10-22 HISTORY — DX: Hyperlipidemia, unspecified: E78.5

## 2014-10-22 HISTORY — DX: Chronic kidney disease, unspecified: N18.9

## 2014-10-22 HISTORY — DX: Gastro-esophageal reflux disease without esophagitis: K21.9

## 2014-10-22 HISTORY — DX: Chronic obstructive pulmonary disease, unspecified: J44.9

## 2014-10-22 HISTORY — DX: Personal history of other diseases of the digestive system: Z87.19

## 2014-10-22 HISTORY — DX: Malignant (primary) neoplasm, unspecified: C80.1

## 2014-10-22 HISTORY — DX: Anemia, unspecified: D64.9

## 2014-10-22 HISTORY — DX: Type 2 diabetes mellitus without complications: E11.9

## 2014-10-22 HISTORY — DX: Unspecified osteoarthritis, unspecified site: M19.90

## 2014-10-22 HISTORY — DX: Diverticulosis of intestine, part unspecified, without perforation or abscess without bleeding: K57.90

## 2014-10-22 HISTORY — DX: Essential (primary) hypertension: I10

## 2014-10-22 LAB — CBC
HCT: 28.9 % — ABNORMAL LOW (ref 40.0–52.0)
HEMOGLOBIN: 9.5 g/dL — AB (ref 13.0–18.0)
MCH: 31.2 pg (ref 26.0–34.0)
MCHC: 33 g/dL (ref 32.0–36.0)
MCV: 94.4 fL (ref 80.0–100.0)
Platelets: 258 10*3/uL (ref 150–440)
RBC: 3.06 MIL/uL — AB (ref 4.40–5.90)
RDW: 13.7 % (ref 11.5–14.5)
WBC: 8.8 10*3/uL (ref 3.8–10.6)

## 2014-10-22 LAB — BASIC METABOLIC PANEL
Anion gap: 6 (ref 5–15)
BUN: 37 mg/dL — ABNORMAL HIGH (ref 6–20)
CHLORIDE: 108 mmol/L (ref 101–111)
CO2: 25 mmol/L (ref 22–32)
CREATININE: 1.46 mg/dL — AB (ref 0.61–1.24)
Calcium: 9.2 mg/dL (ref 8.9–10.3)
GFR, EST AFRICAN AMERICAN: 50 mL/min — AB (ref >60)
GFR, EST NON AFRICAN AMERICAN: 43 mL/min — AB (ref >60)
GLUCOSE: 184 mg/dL — AB (ref 65–99)
Potassium: 4.5 mmol/L (ref 3.5–5.1)
Sodium: 139 mmol/L (ref 135–145)

## 2014-10-22 NOTE — OR Nursing (Signed)
Chart to anesthesia to review labs---spoke with Dr. Andree Elk.

## 2014-10-22 NOTE — Patient Instructions (Signed)
  Your procedure is scheduled [id number] 8, 2016 Report to Same Day Surgery. To find out your arrival time please call [phone] between 1PM - 3PM on October 22, 2014 Remember: Instructions that are not followed completely may result in serious medical risk, up to and including death, or upon the discretion of your surgeon and anesthesiologist your surgery may need to be rescheduled.    __x__ 1. Do not eat food or drink liquids after midnight. No gum chewing or hard candies.     ____ 2. No Alcohol for 24 hours before or after surgery.   ____ 3. Bring all medications with you on the day of surgery if instructed.    __x__ 4. Notify your doctor if there is any change in your medical condition     (cold, fever, infections).     Do not wear jewelry, make-up, hairpins, clips or nail polish.  Do not wear lotions, powders, or perfumes. You may wear deodorant.  Do not shave 48 hours prior to surgery. Men may shave face and neck.  Do not bring valuables to the hospital.    [facility] is not responsible for any belongings or valuables.               Contacts, dentures or bridgework may not be worn into surgery.  Leave your suitcase in the car. After surgery it may be brought to your room.  For patients admitted to the hospital, discharge time is determined by your                treatment team.   Patients discharged the day of surgery will not be allowed to drive home.   Please read over the following fact sheets that you were given:   Surgical Site Infection Prevention   ____ Take these medicines the morning of surgery with A SIP OF WATER:    1.  2.   3.   4.  5.  6.  ____ Fleet Enema (as directed)   __x__ Use CHG Soap as directed  ____ Use inhalers on the day of surgery  ____ Stop metformin 2 days prior to surgery    ____ Take 1/2 of usual insulin dose the night before surgery and none on the morning of surgery.   ____ Stop Coumadin/Plavix/aspirin on   __x_ Stop  Anti-inflammatories on now __x__ Stop supplements until after surgery.  Vitamin B-12 now  ____ Bring C-Pap to the hospital.

## 2014-10-23 ENCOUNTER — Ambulatory Visit
Admission: RE | Admit: 2014-10-23 | Discharge: 2014-10-23 | Disposition: A | Discharge status: Home / Self Care | Payer: Medicare Other | Source: Ambulatory Visit | Attending: Unknown Physician Specialty | Admitting: Unknown Physician Specialty

## 2014-10-23 ENCOUNTER — Encounter
Admission: RE | Disposition: A | Discharge status: Home / Self Care | Payer: Self-pay | Source: Ambulatory Visit | Attending: Unknown Physician Specialty

## 2014-10-23 ENCOUNTER — Encounter: Payer: Self-pay | Admitting: *Deleted

## 2014-10-23 ENCOUNTER — Ambulatory Visit: Payer: Medicare Other | Admitting: Anesthesiology

## 2014-10-23 ENCOUNTER — Other Ambulatory Visit: Payer: Self-pay | Admitting: Internal Medicine

## 2014-10-23 DIAGNOSIS — Z87891 Personal history of nicotine dependence: Secondary | ICD-10-CM | POA: Insufficient documentation

## 2014-10-23 DIAGNOSIS — G5602 Carpal tunnel syndrome, left upper limb: Secondary | ICD-10-CM | POA: Insufficient documentation

## 2014-10-23 DIAGNOSIS — Z79899 Other long term (current) drug therapy: Secondary | ICD-10-CM | POA: Insufficient documentation

## 2014-10-23 DIAGNOSIS — E785 Hyperlipidemia, unspecified: Secondary | ICD-10-CM | POA: Insufficient documentation

## 2014-10-23 DIAGNOSIS — J449 Chronic obstructive pulmonary disease, unspecified: Secondary | ICD-10-CM | POA: Insufficient documentation

## 2014-10-23 DIAGNOSIS — E119 Type 2 diabetes mellitus without complications: Secondary | ICD-10-CM | POA: Insufficient documentation

## 2014-10-23 DIAGNOSIS — Z86010 Personal history of colonic polyps: Secondary | ICD-10-CM | POA: Insufficient documentation

## 2014-10-23 DIAGNOSIS — Z8249 Family history of ischemic heart disease and other diseases of the circulatory system: Secondary | ICD-10-CM | POA: Insufficient documentation

## 2014-10-23 DIAGNOSIS — K449 Diaphragmatic hernia without obstruction or gangrene: Secondary | ICD-10-CM | POA: Insufficient documentation

## 2014-10-23 DIAGNOSIS — I10 Essential (primary) hypertension: Secondary | ICD-10-CM | POA: Insufficient documentation

## 2014-10-23 DIAGNOSIS — G5601 Carpal tunnel syndrome, right upper limb: Secondary | ICD-10-CM | POA: Insufficient documentation

## 2014-10-23 DIAGNOSIS — K219 Gastro-esophageal reflux disease without esophagitis: Secondary | ICD-10-CM | POA: Insufficient documentation

## 2014-10-23 HISTORY — PX: CARPAL TUNNEL RELEASE: SHX101

## 2014-10-23 LAB — GLUCOSE, CAPILLARY
GLUCOSE-CAPILLARY: 104 mg/dL — AB (ref 65–99)
Glucose-Capillary: 122 mg/dL — ABNORMAL HIGH (ref 65–99)

## 2014-10-23 SURGERY — CARPAL TUNNEL RELEASE
Anesthesia: General | Laterality: Bilateral

## 2014-10-23 MED ORDER — SODIUM CHLORIDE 0.9 % IV SOLN
INTRAVENOUS | Status: DC
  Administered 2014-10-23: 10:00:00 via INTRAVENOUS

## 2014-10-23 MED ORDER — FAMOTIDINE 20 MG PO TABS
20.0000 mg | ORAL_TABLET | Freq: Once | ORAL | Status: AC
  Administered 2014-10-23: 20 mg via ORAL

## 2014-10-23 MED ORDER — FENTANYL CITRATE (PF) 100 MCG/2ML IJ SOLN
INTRAMUSCULAR | Status: AC
  Filled 2014-10-23: qty 2

## 2014-10-23 MED ORDER — BUPIVACAINE HCL (PF) 0.5 % IJ SOLN
INTRAMUSCULAR | Status: AC
  Filled 2014-10-23: qty 30

## 2014-10-23 MED ORDER — NORCO 5-325 MG PO TABS: 1.0000 | ORAL_TABLET | Freq: Four times a day (QID) | ORAL | Status: DC | PRN

## 2014-10-23 MED ORDER — ONDANSETRON HCL 4 MG/2ML IJ SOLN: 4.0000 mg | Freq: Once | INTRAMUSCULAR | Status: DC | PRN

## 2014-10-23 MED ORDER — EPHEDRINE SULFATE 50 MG/ML IJ SOLN
INTRAMUSCULAR | Status: DC | PRN
  Administered 2014-10-23: 5 mg via INTRAVENOUS

## 2014-10-23 MED ORDER — PROPOFOL 10 MG/ML IV BOLUS
INTRAVENOUS | Status: DC | PRN
  Administered 2014-10-23: 130 mg via INTRAVENOUS

## 2014-10-23 MED ORDER — ONDANSETRON HCL 4 MG/2ML IJ SOLN
INTRAMUSCULAR | Status: DC | PRN
  Administered 2014-10-23: 4 mg via INTRAVENOUS

## 2014-10-23 MED ORDER — LIDOCAINE HCL (CARDIAC) 20 MG/ML IV SOLN
INTRAVENOUS | Status: DC | PRN
  Administered 2014-10-23: 100 mg via INTRAVENOUS

## 2014-10-23 MED ORDER — FENTANYL CITRATE (PF) 100 MCG/2ML IJ SOLN
INTRAMUSCULAR | Status: DC | PRN
  Administered 2014-10-23 (×3): 25 ug via INTRAVENOUS

## 2014-10-23 MED ORDER — FENTANYL CITRATE (PF) 100 MCG/2ML IJ SOLN
25.0000 ug | INTRAMUSCULAR | Status: DC | PRN
  Administered 2014-10-23 (×2): 25 ug via INTRAVENOUS

## 2014-10-23 MED ORDER — BUPIVACAINE HCL (PF) 0.5 % IJ SOLN
INTRAMUSCULAR | Status: DC | PRN
  Administered 2014-10-23: 20 mL

## 2014-10-23 SURGICAL SUPPLY — 33 items
BANDAGE ELASTIC 2 VELCRO NS LF (GAUZE/BANDAGES/DRESSINGS) IMPLANT
BANDAGE GZE STRCH 3 X5YDS N/ST (GAUZE/BANDAGES/DRESSINGS) ×6 IMPLANT
BNDG CONFORM 6X.82 1P STRL (GAUZE/BANDAGES/DRESSINGS) ×3 IMPLANT
BNDG ESMARK 4X12 TAN STRL LF (GAUZE/BANDAGES/DRESSINGS) ×3 IMPLANT
CHLORAPREP W/TINT 26ML (MISCELLANEOUS) ×6 IMPLANT
COVER LIGHT HANDLE FLEXIBLE (MISCELLANEOUS) IMPLANT
CUFF TOURN SGL QUICK 18 (TOURNIQUET CUFF) IMPLANT
DECANTER SPIKE VIAL GLASS SM (MISCELLANEOUS) ×3 IMPLANT
DURAPREP 26ML APPLICATOR (WOUND CARE) IMPLANT
GAUZE SPONGE 4X4 12PLY STRL (GAUZE/BANDAGES/DRESSINGS) ×6 IMPLANT
GLOVE BIO SURGEON STRL SZ7.5 (GLOVE) ×3 IMPLANT
GLOVE BIO SURGEON STRL SZ8 (GLOVE) ×9 IMPLANT
GLOVE INDICATOR 8.0 STRL GRN (GLOVE) ×6 IMPLANT
GOWN STRL REUS W/ TWL LRG LVL3 (GOWN DISPOSABLE) ×2 IMPLANT
GOWN STRL REUS W/TWL LRG LVL3 (GOWN DISPOSABLE) ×4
LOOP VESSEL RED MINI 1.3X0.9 (MISCELLANEOUS) IMPLANT
LOOPS RED MINI 1.3MMX0.9MM (MISCELLANEOUS)
NS IRRIG 500ML POUR BTL (IV SOLUTION) ×3 IMPLANT
PACK EXTREMITY ARMC (MISCELLANEOUS) ×3 IMPLANT
PAD CAST CTTN 4X4 STRL (SOFTGOODS) ×1 IMPLANT
PAD GROUND ADULT SPLIT (MISCELLANEOUS) ×3 IMPLANT
PADDING CAST 2X4YD ST (MISCELLANEOUS) ×4
PADDING CAST BLEND 2X4 STRL (MISCELLANEOUS) ×2 IMPLANT
PADDING CAST COTTON 4X4 STRL (SOFTGOODS) ×2
SOL PREP PVP 2OZ (MISCELLANEOUS) ×3
SOLUTION PREP PVP 2OZ (MISCELLANEOUS) ×1 IMPLANT
SPLINT CAST 1 STEP 3X12 (MISCELLANEOUS) IMPLANT
SPLINT WRIST LG LT TX990309 (SOFTGOODS) ×3 IMPLANT
SPLINT WRIST LG RT TX900304 (SOFTGOODS) ×3 IMPLANT
STOCKINETTE ORTHO 6X25 (MISCELLANEOUS) ×6 IMPLANT
SUT ETHILON 4-0 (SUTURE) ×4
SUT ETHILON 4-0 FS2 18XMFL BLK (SUTURE) ×2
SUTURE ETHLN 4-0 FS2 18XMF BLK (SUTURE) ×2 IMPLANT

## 2014-10-23 NOTE — Anesthesia Procedure Notes (Signed)
Procedure Name: LMA Insertion Date/Time: 10/23/2014 12:07 PM Performed by: Aline Brochure Pre-anesthesia Checklist: Patient identified, Emergency Drugs available, Suction available and Patient being monitored Patient Re-evaluated:Patient Re-evaluated prior to inductionOxygen Delivery Method: Circle system utilized Preoxygenation: Pre-oxygenation with 100% oxygen Intubation Type: IV induction Ventilation: Mask ventilation without difficulty LMA: LMA inserted LMA Size: 4.5 Number of attempts: 1 Airway Equipment and Method: Patient positioned with wedge pillow Placement Confirmation: positive ETCO2 and breath sounds checked- equal and bilateral Tube secured with: Tape Dental Injury: Teeth and Oropharynx as per pre-operative assessment

## 2014-10-23 NOTE — Discharge Instructions (Addendum)
Intermittent elevation  Use ice packs as needed  RTC in about 10 days  Can remove dressings in about 3 days and apply waterproof occlusive bandaids to the wounds. Change bandaids after showering.  Continue with splints until seen in the office     AMBULATORY SURGERY  DISCHARGE INSTRUCTIONS   1) The drugs that you were given will stay in your system until tomorrow so for the next 24 hours you should not:  A) Drive an automobile B) Make any legal decisions C) Drink any alcoholic beverage   2) You may resume regular meals tomorrow.  Today it is better to start with liquids and gradually work up to solid foods.  You may eat anything you prefer, but it is better to start with liquids, then soup and crackers, and gradually work up to solid foods.   3) Please notify your doctor immediately if you have any unusual bleeding, trouble breathing, redness and pain at the surgery site, drainage, fever, or pain not relieved by medication.  4) Your post-operative visit with Dr.                                     is: Date:                        Time:    Please call to schedule your post-operative visit.  5) Additional Instructions:

## 2014-10-23 NOTE — Anesthesia Postprocedure Evaluation (Signed)
  Anesthesia Post-op Note  Patient: Shawn Mendez  Procedure(s) Performed: Procedure(s): CARPAL TUNNEL RELEASE (Bilateral)  Anesthesia type:General  Patient location: PACU  Post pain: Pain level controlled  Post assessment: Post-op Vital signs reviewed, Patient's Cardiovascular Status Stable, Respiratory Function Stable, Patent Airway and No signs of Nausea or vomiting  Post vital signs: Reviewed and stable  Last Vitals:  Filed Vitals:   10/23/14 1317  BP: 150/57  Pulse: 60  Temp: 36.1 C  Resp: 13    Level of consciousness: awake, alert  and patient cooperative  Complications: No apparent anesthesia complications

## 2014-10-23 NOTE — Transfer of Care (Signed)
Immediate Anesthesia Transfer of Care Note  Patient: Shawn Mendez  Procedure(s) Performed: Procedure(s): CARPAL TUNNEL RELEASE (Bilateral)  Patient Location: PACU  Anesthesia Type:General  Level of Consciousness: awake  Airway & Oxygen Therapy: Patient Spontanous Breathing and Patient connected to face mask oxygen  Post-op Assessment: Report given to RN and Post -op Vital signs reviewed and stable  Post vital signs: stable  Last Vitals:  Filed Vitals:   10/23/14 1317  BP: 150/57  Pulse: 60  Temp: 36.1 C  Resp: 13    Complications: No apparent anesthesia complications

## 2014-10-23 NOTE — Op Note (Signed)
DATE OF SURGERY:  10/23/2014  PATIENT NAME:  Shawn Mendez   DOB: [birth year]  MRN: [id number]  PRE-OPERATIVE DIAGNOSIS: Bilateral carpal tunnel syndrome  POST-OPERATIVE DIAGNOSIS:  Same  PROCEDURE: Bilateral carpal tunnel release  SURGEON: Dr. Leanor Kail, Brooke Bonito. M.D.  ANESTHESIA: Gen.  ESTIMATED BLOOD LOSS: neglible  TOURNIQUET TIME: 12 minutes on the right and 9 minutes on the left  DRAINS: None  INDICATIONS FOR SURGERY: Torrie Lafavor is a 81 y.o. year old male with a long history of numbness and paresthesias in both hands. Nerve conduction studies demonstrated findings consistent with severe bilateral carpal tunnel syndrome.The patient had not seen any significant improvement despite conservative nonsurgical intervention. After discussion of the risks and benefits of surgical intervention, the patient expressed understanding of the risks benefits and agree with plans for carpal tunnel release.   PROCEDURE IN DETAIL: The patient was brought into the operating room and after adequate general anesthesia, a tourniquet was placed on the patient's right upper arm.The right hand and arm were prepped with alcohol and Duraprep and draped in the usual sterile fashion. A "time-out" was performed as per usual protocol. The hand and forearm were exsanguinated using an Esmarch and the tourniquet was inflated to 250 mmHg.  An incision was made just ulnar to the thenar palmar crease. Dissection was carried down through the palmar fascia to the transverse carpal ligament. The transverse carpal ligament was sharply incised, taking care to protect the underlying structures with the carpal tunnel. Complete release of the transverse carpal ligament was achieved. There was no evidence of a mass or proliferative synovitis within the carpal tunnel. The wound was irrigated with copious amounts of normal saline with antibiotic solution. The tourniquet was released at this time. Bleeding was controlled with  coagulation cautery. The skin was then re-approximated with interrupted sutures of #4-0 nylon. A sterile dressing was applied followed by application of a volar splint.  Left upper extremity was then prepped and draped after tourniquet had been applied to the left upper arm and then carpal tunnel release was performed without difficulty. Volar splint was applied to the left wrist after the procedure.  The patient tolerated the procedures well and was transported to the PACU in stable condition.    Dr. Mariel Kansky. M.D.

## 2014-10-23 NOTE — Anesthesia Preprocedure Evaluation (Signed)
Anesthesia Evaluation  Patient identified by MRN, date of birth, ID band Patient awake    Reviewed: Allergy & Precautions, NPO status , Patient's Chart, lab work & pertinent test results  History of Anesthesia Complications Negative for: history of anesthetic complications  Airway Mallampati: II  TM Distance: >3 FB Neck ROM: Full    Dental  (+) Partial Lower   Pulmonary COPD (Pt without symptoms, no tx)former smoker (quit x 40 yrs),          Cardiovascular hypertension, Pt. on medications     Neuro/Psych    GI/Hepatic hiatal hernia, GERD- (not on meds. )  ,  Endo/Other  diabetes, Well Controlled, Type 2, Oral Hypoglycemic Agents  Renal/GU Renal InsufficiencyRenal disease     Musculoskeletal   Abdominal   Peds  Hematology   Anesthesia Other Findings   Reproductive/Obstetrics                             Anesthesia Physical Anesthesia Plan  ASA: III  Anesthesia Plan: General   Post-op Pain Management:    Induction: Intravenous  Airway Management Planned: LMA  Additional Equipment:   Intra-op Plan:   Post-operative Plan:   Informed Consent: I have reviewed the patients History and Physical, chart, labs and discussed the procedure including the risks, benefits and alternatives for the proposed anesthesia with the patient or authorized representative who has indicated his/her understanding and acceptance.     Plan Discussed with:   Anesthesia Plan Comments:         Anesthesia Quick Evaluation

## 2014-10-23 NOTE — H&P (Signed)
  H and P reviewed. No changes. Uploaded at later date. 

## 2014-10-25 ENCOUNTER — Encounter: Payer: Self-pay | Admitting: Internal Medicine

## 2014-10-25 ENCOUNTER — Other Ambulatory Visit: Payer: Self-pay | Admitting: Internal Medicine

## 2014-10-25 DIAGNOSIS — I2510 Atherosclerotic heart disease of native coronary artery without angina pectoris: Secondary | ICD-10-CM | POA: Insufficient documentation

## 2014-10-25 DIAGNOSIS — D692 Other nonthrombocytopenic purpura: Secondary | ICD-10-CM | POA: Insufficient documentation

## 2014-10-25 DIAGNOSIS — C61 Malignant neoplasm of prostate: Secondary | ICD-10-CM | POA: Insufficient documentation

## 2014-10-25 DIAGNOSIS — I10 Essential (primary) hypertension: Secondary | ICD-10-CM | POA: Insufficient documentation

## 2014-10-25 DIAGNOSIS — E782 Mixed hyperlipidemia: Secondary | ICD-10-CM

## 2014-10-25 DIAGNOSIS — K5732 Diverticulitis of large intestine without perforation or abscess without bleeding: Secondary | ICD-10-CM | POA: Insufficient documentation

## 2014-10-25 DIAGNOSIS — I2584 Coronary atherosclerosis due to calcified coronary lesion: Secondary | ICD-10-CM

## 2014-10-25 DIAGNOSIS — N184 Chronic kidney disease, stage 4 (severe): Secondary | ICD-10-CM | POA: Insufficient documentation

## 2014-10-25 DIAGNOSIS — E1129 Type 2 diabetes mellitus with other diabetic kidney complication: Secondary | ICD-10-CM | POA: Insufficient documentation

## 2014-10-25 DIAGNOSIS — E1169 Type 2 diabetes mellitus with other specified complication: Secondary | ICD-10-CM | POA: Insufficient documentation

## 2014-10-25 DIAGNOSIS — K279 Peptic ulcer, site unspecified, unspecified as acute or chronic, without hemorrhage or perforation: Secondary | ICD-10-CM | POA: Insufficient documentation

## 2014-10-31 DIAGNOSIS — G5600 Carpal tunnel syndrome, unspecified upper limb: Secondary | ICD-10-CM | POA: Insufficient documentation

## 2014-11-07 ENCOUNTER — Other Ambulatory Visit: Payer: Self-pay | Admitting: Internal Medicine

## 2014-12-03 DIAGNOSIS — M542 Cervicalgia: Secondary | ICD-10-CM | POA: Insufficient documentation

## 2014-12-03 DIAGNOSIS — M545 Low back pain, unspecified: Secondary | ICD-10-CM | POA: Insufficient documentation

## 2014-12-08 ENCOUNTER — Emergency Department
Admission: EM | Admit: 2014-12-08 | Discharge: 2014-12-08 | Disposition: A | Discharge status: Home / Self Care | Payer: Medicare Other | Attending: Emergency Medicine | Admitting: Emergency Medicine

## 2014-12-08 ENCOUNTER — Other Ambulatory Visit: Payer: Self-pay

## 2014-12-08 ENCOUNTER — Emergency Department: Payer: Medicare Other

## 2014-12-08 DIAGNOSIS — Z79899 Other long term (current) drug therapy: Secondary | ICD-10-CM | POA: Diagnosis not present

## 2014-12-08 DIAGNOSIS — E119 Type 2 diabetes mellitus without complications: Secondary | ICD-10-CM | POA: Insufficient documentation

## 2014-12-08 DIAGNOSIS — R2242 Localized swelling, mass and lump, left lower limb: Secondary | ICD-10-CM | POA: Diagnosis present

## 2014-12-08 DIAGNOSIS — Z87891 Personal history of nicotine dependence: Secondary | ICD-10-CM | POA: Insufficient documentation

## 2014-12-08 DIAGNOSIS — R609 Edema, unspecified: Secondary | ICD-10-CM | POA: Insufficient documentation

## 2014-12-08 DIAGNOSIS — N183 Chronic kidney disease, stage 3 (moderate): Secondary | ICD-10-CM | POA: Insufficient documentation

## 2014-12-08 DIAGNOSIS — I129 Hypertensive chronic kidney disease with stage 1 through stage 4 chronic kidney disease, or unspecified chronic kidney disease: Secondary | ICD-10-CM | POA: Insufficient documentation

## 2014-12-08 LAB — BASIC METABOLIC PANEL
ANION GAP: 6 (ref 5–15)
BUN: 30 mg/dL — AB (ref 6–20)
CALCIUM: 8.9 mg/dL (ref 8.9–10.3)
CO2: 25 mmol/L (ref 22–32)
CREATININE: 1.5 mg/dL — AB (ref 0.61–1.24)
Chloride: 107 mmol/L (ref 101–111)
GFR calc Af Amer: 48 mL/min — ABNORMAL LOW (ref >60)
GFR calc non Af Amer: 42 mL/min — ABNORMAL LOW (ref >60)
GLUCOSE: 123 mg/dL — AB (ref 65–99)
Potassium: 4.4 mmol/L (ref 3.5–5.1)
Sodium: 138 mmol/L (ref 135–145)

## 2014-12-08 LAB — CBC
HCT: 27.7 % — ABNORMAL LOW (ref 40.0–52.0)
HEMOGLOBIN: 9 g/dL — AB (ref 13.0–18.0)
MCH: 30 pg (ref 26.0–34.0)
MCHC: 32.5 g/dL (ref 32.0–36.0)
MCV: 92.2 fL (ref 80.0–100.0)
Platelets: 244 10*3/uL (ref 150–440)
RBC: 3 MIL/uL — ABNORMAL LOW (ref 4.40–5.90)
RDW: 14 % (ref 11.5–14.5)
WBC: 7.3 10*3/uL (ref 3.8–10.6)

## 2014-12-08 LAB — BRAIN NATRIURETIC PEPTIDE: B Natriuretic Peptide: 301 pg/mL — ABNORMAL HIGH (ref 0.0–100.0)

## 2014-12-08 MED ORDER — FUROSEMIDE 20 MG PO TABS: 20.0000 mg | ORAL_TABLET | Freq: Every day | ORAL | Status: DC

## 2014-12-08 NOTE — Discharge Instructions (Signed)
Peripheral Edema  Please return to the emergency room right away should he develop trouble breathing, have a fever, develop a cough, chest pain, redness or pain in the legs, or other new concerns arise.  You have swelling in your legs (peripheral edema). This swelling is due to excess accumulation of salt and water in your body. Edema may be a sign of heart, kidney or liver disease, or a side effect of a medication. It may also be due to problems in the leg veins. Elevating your legs and using special support stockings may be very helpful, if the cause of the swelling is due to poor venous circulation. Avoid long periods of standing, whatever the cause. Treatment of edema depends on identifying the cause. Chips, pretzels, pickles and other salty foods should be avoided. Restricting salt in your diet is almost always needed. Water pills (diuretics) are often used to remove the excess salt and water from your body via urine. These medicines prevent the kidney from reabsorbing sodium. This increases urine flow. Diuretic treatment may also result in lowering of potassium levels in your body. Potassium supplements may be needed if you have to use diuretics daily. Daily weights can help you keep track of your progress in clearing your edema. You should call your caregiver for follow up care as recommended. SEEK IMMEDIATE MEDICAL CARE IF:   You have increased swelling, pain, redness, or heat in your legs.  You develop shortness of breath, especially when lying down.  You develop chest or abdominal pain, weakness, or fainting.  You have a fever. Document Released: 06/10/2004 Document Revised: 07/26/2011 Document Reviewed: 05/21/2009 [facility] Patient Information 2015 [place], [place]. This information is not intended to replace advice given to you by your health care provider. Make sure you discuss any questions you have with your health care provider.

## 2014-12-08 NOTE — ED Provider Notes (Signed)
[facility] Emergency Department Provider Note  ____________________________________________  Time seen: Approximately 6:12 PM  I have reviewed the triage vital signs and the nursing notes.   HISTORY  Chief Complaint Leg Swelling    HPI Shawn Mendez is a 81 y.o. male history of high blood pressure and chronic kidney disease. He is noticed for about the last 3-4 days that he has had some increasing swelling in his legs. He reports he has some chronic swelling in the right leg since he had a knee replacement years ago, but is also seeing a fair amount of swine in the left leg. This is been slowly increasing the last 3-4 days. No pain, weakness, numbness, tingling, redness, fever, chills. He denies feeling short of breath, no chest pain or palpitations.  He has not been outside much recently, he has not had any exposure to any significant heat.   Past Medical History  Diagnosis Date  . Hypertension   . Diabetes mellitus without complication   . Chronic kidney disease     Stage 3 renal failure  . GERD (gastroesophageal reflux disease)   . History of hiatal hernia   . Arthritis   . Cancer     prostate  . Anemia   . Diverticulosis   . COPD (chronic obstructive pulmonary disease)     previous smoker  . Hyperlipidemia     Patient Active Problem List   Diagnosis Date Noted  . Kidney failure 10/25/2014  . Diverticulitis large intestine 10/25/2014  . Calcification of coronary artery 10/25/2014  . Essential (primary) hypertension 10/25/2014  . Combined fat and carbohydrate induced hyperlipemia 10/25/2014  . CA of prostate 10/25/2014  . Gastroduodenal ulcer 10/25/2014  . Purpura senilis 10/25/2014  . Diabetes mellitus, type 2 10/25/2014  . Abnormal CAT scan 09/17/2013  . Diabetes 09/17/2013  . BP (high blood pressure) 09/17/2013  . HLD (hyperlipidemia) 09/17/2013    Past Surgical History  Procedure Laterality Date  . Back surgery  1972     Fusion  . Hernia repair Bilateral 1980    inguinal hernia repair  . Prostatectomy  1995  . Joint replacement Right     total knee replacement  . Right total knee replacement    . Hammer toe surgery Right   . Septoplasty    . Eye surgery Bilateral     cataract extraction  . Cholecystectomy  2000  . Carpal tunnel release Bilateral 10/23/2014    Procedure: CARPAL TUNNEL RELEASE;  Surgeon: Leanor Kail, MD;  Location: [facility] ORS;  Service: Orthopedics;  Laterality: Bilateral;    Current Outpatient Rx  Name  Route  Sig  Dispense  Refill  . Cholecalciferol (VITAMIN D3) 1000 UNITS CAPS   Oral   Take 1 capsule by mouth daily.         . folic acid (FOLVITE) 700 MCG tablet   Oral   Take 800 mcg by mouth daily.         . furosemide (LASIX) 20 MG tablet   Oral   Take 1 tablet (20 mg total) by mouth daily.   5 tablet   0   . gabapentin (NEURONTIN) 100 MG capsule   Oral   Take 100 mg by mouth at bedtime as needed.         Marland Kitchen glimepiride (AMARYL) 1 MG tablet      TAKE ONE-HALF (1/2) TABLET DAILY   45 tablet   2   . Glucose Blood (BLOOD GLUCOSE TEST VI)  In Vitro   1 each by In Vitro route 2 (two) times daily.         Marland Kitchen glucose blood test strip      1 each 2 (two) times daily.         Marland Kitchen HYDROcodone-acetaminophen (NORCO/VICODIN) 5-325 MG per tablet   Oral   Take 1 tablet by mouth every 4 (four) hours as needed for moderate pain.         Marland Kitchen losartan-hydrochlorothiazide (HYZAAR) 100-25 MG per tablet   Oral   Take 1 tablet by mouth at bedtime.         . MULTIPLE VITAMINS-MINERALS PO   Oral   Take 1 tablet by mouth daily.         Lebron Quam 5-325 MG per tablet   Oral   Take 1-2 tablets by mouth every 6 (six) hours as needed for moderate pain. MAXIMUM TOTAL ACETAMINOPHEN DOSE IS 4000 MG PER DAY   30 tablet   0     Dispense as written.   . pravastatin (PRAVACHOL) 20 MG tablet   Oral   Take 20 mg by mouth at bedtime.         . Psyllium (METAMUCIL  MULTIHEALTH FIBER) 58.12 % PACK   Oral   Take 1 packet by mouth daily.         . Selenium 200 MCG TBCR   Oral   Take 1 tablet by mouth daily.         Marland Kitchen Specialty Vitamins Products (MAGNESIUM, AMINO ACID CHELATE,) 133 MG tablet   Oral   Take 1 tablet by mouth daily.         . vitamin B-12 (CYANOCOBALAMIN) 1000 MCG tablet   Oral   Take 1,000 mcg by mouth daily.           Allergies Review of patient's allergies indicates no known allergies.  Family History  Problem Relation Age of Onset  . Hypertension Mother   . Congestive Heart Failure Mother   . Heart attack Father     Social History History  Substance Use Topics  . Smoking status: Former Smoker -- 1.00 packs/day    Types: Cigarettes    Quit date: 10/17/1978  . Smokeless tobacco: Never Used  . Alcohol Use: No    Review of Systems Constitutional: No fever/chills Eyes: No visual changes. ENT: No sore throat. Cardiovascular: Denies chest pain. Respiratory: Denies shortness of breath. Gastrointestinal: No abdominal pain.  No nausea, no vomiting.  No diarrhea.  No constipation. Genitourinary: Negative for dysuria. Musculoskeletal: Negative for back pain. Skin: Negative for rash. Neurological: Negative for headaches, focal weakness or numbness.  10-point ROS otherwise negative.  ____________________________________________   PHYSICAL EXAM:  VITAL SIGNS: ED Triage Vitals  Enc Vitals Group     BP --      Pulse Rate 12/08/14 1619 62     Resp 12/08/14 1619 16     Temp --      Temp src --      SpO2 12/08/14 1619 96 %     Weight 12/08/14 1619 170 lb (77.111 kg)     Height 12/08/14 1619 5\' 8"  (1.727 m)     Head Cir --      Peak Flow --      Pain Score 12/08/14 1621 0     Pain Loc --      Pain Edu? --      Excl. in [place]? --     Constitutional:  Alert and oriented. Well appearing and in no acute distress. Eyes: Conjunctivae are normal. PERRL. EOMI. Head: Atraumatic. Nose: No  congestion/rhinnorhea. Mouth/Throat: Mucous membranes are moist.  Oropharynx non-erythematous. Neck: No stridor.   Cardiovascular: Normal rate, regular rhythm. Grossly normal heart sounds.  Good peripheral circulation. Respiratory: Normal respiratory effort.  No retractions. Lungs CTAB. Very clear lungs. Speaking in full and normal sentences. Gastrointestinal: Soft and nontender. No distention. No abdominal bruits. No CVA tenderness. Musculoskeletal: No lower extremity tenderness but does have 1+ lower extremity pitting edema which may be slightly worse on the left than on the right.  No joint effusions. Dorsalis pedis pulses normal rate and good circulation. No erythema or tenderness. Patient does have some slight edema limited to the left hand as well, but states this is chronic since having surgery on his carpal tunnel, not causing him any weakness numbness or tingling in the hand. There is no edema in the forearm or arm and no venous cords or symptoms to suggest DVT of the upper extremity. Neurologic:  Normal speech and language. No gross focal neurologic deficits are appreciated. Walks with a normal and stable gait. Skin:  Skin is warm, dry and intact. No rash noted. Psychiatric: Mood and affect are normal. Speech and behavior are normal.  ____________________________________________   LABS (all labs ordered are listed, but only abnormal results are displayed)  Labs Reviewed  BRAIN NATRIURETIC PEPTIDE - Abnormal; Notable for the following:    B Natriuretic Peptide 301.0 (*)    All other components within normal limits  CBC - Abnormal; Notable for the following:    RBC 3.00 (*)    Hemoglobin 9.0 (*)    HCT 27.7 (*)    All other components within normal limits  BASIC METABOLIC PANEL - Abnormal; Notable for the following:    Glucose, Bld 123 (*)    BUN 30 (*)    Creatinine, Ser 1.50 (*)    GFR calc non Af Amer 42 (*)    GFR calc Af Amer 48 (*)    All other components within normal  limits   ____________________________________________  EKG  ED ECG REPORT I, Eddy Termine, the attending physician, personally viewed and interpreted this ECG.  Date: 12/08/2014 EKG Time: 2042 Rate: 60 Rhythm: normal sinus rhythm QRS Axis: normal Intervals: normal ST/T Wave abnormalities: normal Conduction Disutrbances: none Narrative Interpretation: unremarkable  ____________________________________________  RADIOLOGY  Narrative:    CLINICAL DATA: New onset lower extremity swelling.  EXAM: CHEST 2 VIEW  COMPARISON: None.  FINDINGS: There is hazy left lower lobe airspace disease which may reflect chronic interstitial disease versus pneumonia. There is no other focal parenchymal opacity. There is a calcified right lower lobe pulmonary nodule likely reflecting sequela prior granulomatous disease. There is no pleural effusion or pneumothorax. The heart and mediastinal contours are unremarkable.  There is an old left anterior rib fracture.  IMPRESSION: There is hazy left lower lobe airspace disease which may reflect chronic interstitial disease versus pneumonia.   IMPRESSION: No evidence of deep venous thrombosis in either lower extremity. Note that calf vein visualization is limited, particular on the left, due to lower extremity edema. ____________________________________________   PROCEDURES  Procedure(s) performed: None  Critical Care performed: No  ____________________________________________   INITIAL IMPRESSION / ASSESSMENT AND PLAN / ED COURSE  Pertinent labs & imaging results that were available during my care of the patient were reviewed by me and considered in my medical decision making (see chart for details).  Lower extremity  edema. Etiology is not obvious based on exam, but does appear to be bilateral in nature and I suspect this is likely some peripheral edema but we will evaluate for evidence of congestive heart failure, DVT. No  evidence of infection or cellulitis.  ----------------------------------------- 7:24 PM on 12/08/2014 -----------------------------------------  Reviewed the patient's x-ray myself, and in discussing his symptoms he has no symptoms of any sort of air space disease or pneumonia. No fevers or chills. I feel that this likely does not represent any sort of an acute infectious etiology. Discussed with the patient and advised him that should he develop any symptoms like a cough, fevers, chills, or trouble breathing that he should return to the ER but I would recommend against initiating any antibiotic's at this point given he has no acute symptomatology. Continue to await results of venous Doppler to rule out DVT.   Discussed with Dr. Josefa Half regarding the patient's chronic renal disease and concerns for peripheral edema. He recommends initiate the patient on 20 mg of Lasix daily and have him follow-up in the cardiology clinic middle of this week. I think this is reasonable and propofol provide the patient with a prescription for 4 days of Lasix.  Return precautions advised including any fever, cough, trouble breathing, redness, chest pain, or other new concerns.   ____________________________________________   FINAL CLINICAL IMPRESSION(S) / ED DIAGNOSES  Final diagnoses:  Dependent edema      Delman Kitten, MD 12/08/14 2059

## 2014-12-08 NOTE — ED Notes (Signed)
Patient transported to Ultrasound 

## 2014-12-08 NOTE — ED Notes (Signed)
Pt here for swelling in both legs. Started about 2 days ago. This is something new for him.

## 2014-12-09 ENCOUNTER — Other Ambulatory Visit: Payer: Self-pay

## 2014-12-09 ENCOUNTER — Encounter: Payer: Self-pay | Admitting: Emergency Medicine

## 2014-12-09 ENCOUNTER — Emergency Department: Payer: Medicare Other

## 2014-12-09 ENCOUNTER — Emergency Department
Admission: EM | Admit: 2014-12-09 | Discharge: 2014-12-09 | Disposition: A | Discharge status: Home / Self Care | Payer: Medicare Other | Attending: Emergency Medicine | Admitting: Emergency Medicine

## 2014-12-09 DIAGNOSIS — E119 Type 2 diabetes mellitus without complications: Secondary | ICD-10-CM | POA: Insufficient documentation

## 2014-12-09 DIAGNOSIS — Z79899 Other long term (current) drug therapy: Secondary | ICD-10-CM | POA: Diagnosis not present

## 2014-12-09 DIAGNOSIS — N189 Chronic kidney disease, unspecified: Secondary | ICD-10-CM | POA: Diagnosis not present

## 2014-12-09 DIAGNOSIS — R51 Headache: Secondary | ICD-10-CM | POA: Diagnosis not present

## 2014-12-09 DIAGNOSIS — Z87891 Personal history of nicotine dependence: Secondary | ICD-10-CM | POA: Diagnosis not present

## 2014-12-09 DIAGNOSIS — R519 Headache, unspecified: Secondary | ICD-10-CM

## 2014-12-09 DIAGNOSIS — I129 Hypertensive chronic kidney disease with stage 1 through stage 4 chronic kidney disease, or unspecified chronic kidney disease: Secondary | ICD-10-CM | POA: Insufficient documentation

## 2014-12-09 LAB — CBC
HCT: 28.5 % — ABNORMAL LOW (ref 40.0–52.0)
Hemoglobin: 9.3 g/dL — ABNORMAL LOW (ref 13.0–18.0)
MCH: 30.2 pg (ref 26.0–34.0)
MCHC: 32.7 g/dL (ref 32.0–36.0)
MCV: 92.4 fL (ref 80.0–100.0)
Platelets: 260 10*3/uL (ref 150–440)
RBC: 3.09 MIL/uL — ABNORMAL LOW (ref 4.40–5.90)
RDW: 14 % (ref 11.5–14.5)
WBC: 6.9 10*3/uL (ref 3.8–10.6)

## 2014-12-09 LAB — BASIC METABOLIC PANEL
Anion gap: 8 (ref 5–15)
BUN: 36 mg/dL — ABNORMAL HIGH (ref 6–20)
CALCIUM: 9.2 mg/dL (ref 8.9–10.3)
CHLORIDE: 104 mmol/L (ref 101–111)
CO2: 25 mmol/L (ref 22–32)
CREATININE: 1.42 mg/dL — AB (ref 0.61–1.24)
GFR calc Af Amer: 52 mL/min — ABNORMAL LOW (ref >60)
GFR calc non Af Amer: 44 mL/min — ABNORMAL LOW (ref >60)
GLUCOSE: 128 mg/dL — AB (ref 65–99)
Potassium: 4.6 mmol/L (ref 3.5–5.1)
Sodium: 137 mmol/L (ref 135–145)

## 2014-12-09 LAB — SEDIMENTATION RATE: Sed Rate: 1 mm/hr (ref 0–20)

## 2014-12-09 MED ORDER — FLUORESCEIN SODIUM 1 MG OP STRP
ORAL_STRIP | OPHTHALMIC | Status: AC
Start: 2014-12-09 — End: 2014-12-09
  Administered 2014-12-09: 1
  Filled 2014-12-09: qty 1

## 2014-12-09 NOTE — ED Provider Notes (Signed)
[facility] Emergency Department Provider Note  ____________________________________________  Time seen: Approximately 948 PM  I have reviewed the triage vital signs and the nursing notes.   HISTORY  Chief Complaint Headache and Neck Pain    HPI Shawn Mendez is a 81 y.o. male with a history of hypertension, diabetes and chronic kidney disease who presents today with right-sided eye pain radiating to the back of his head. He also had blurred vision at the time. He was sitting in a darkened room watching television at 5 PM when the pain started suddenly. He said he had blurred vision at the time which is now resolved. He says the pain is also completely resolved. He does not have any foreign body sensation in his eye. No history of glaucoma. The nausea vomiting or headache at this time. Denies any neck pain at this time. No fever.   Past Medical History  Diagnosis Date  . Hypertension   . Diabetes mellitus without complication   . Chronic kidney disease     Stage 3 renal failure  . GERD (gastroesophageal reflux disease)   . History of hiatal hernia   . Arthritis   . Cancer     prostate  . Anemia   . Diverticulosis   . COPD (chronic obstructive pulmonary disease)     previous smoker  . Hyperlipidemia     Patient Active Problem List   Diagnosis Date Noted  . Kidney failure 10/25/2014  . Diverticulitis large intestine 10/25/2014  . Calcification of coronary artery 10/25/2014  . Essential (primary) hypertension 10/25/2014  . Combined fat and carbohydrate induced hyperlipemia 10/25/2014  . CA of prostate 10/25/2014  . Gastroduodenal ulcer 10/25/2014  . Purpura senilis 10/25/2014  . Diabetes mellitus, type 2 10/25/2014  . Abnormal CAT scan 09/17/2013  . Diabetes 09/17/2013  . BP (high blood pressure) 09/17/2013  . HLD (hyperlipidemia) 09/17/2013    Past Surgical History  Procedure Laterality Date  . Back surgery  1972    Fusion  . Hernia  repair Bilateral 1980    inguinal hernia repair  . Prostatectomy  1995  . Joint replacement Right     total knee replacement  . Right total knee replacement    . Hammer toe surgery Right   . Septoplasty    . Eye surgery Bilateral     cataract extraction  . Cholecystectomy  2000  . Carpal tunnel release Bilateral 10/23/2014    Procedure: CARPAL TUNNEL RELEASE;  Surgeon: Leanor Kail, MD;  Location: [facility] ORS;  Service: Orthopedics;  Laterality: Bilateral;    Current Outpatient Rx  Name  Route  Sig  Dispense  Refill  . Cholecalciferol (VITAMIN D3) 1000 UNITS CAPS   Oral   Take 1 capsule by mouth daily.         . folic acid (FOLVITE) 546 MCG tablet   Oral   Take 800 mcg by mouth daily.         . furosemide (LASIX) 20 MG tablet   Oral   Take 1 tablet (20 mg total) by mouth daily.   5 tablet   0   . gabapentin (NEURONTIN) 100 MG capsule   Oral   Take 100 mg by mouth at bedtime as needed.         Marland Kitchen glimepiride (AMARYL) 1 MG tablet      TAKE ONE-HALF (1/2) TABLET DAILY   45 tablet   2   . Glucose Blood (BLOOD GLUCOSE TEST VI)   In  Vitro   1 each by In Vitro route 2 (two) times daily.         Marland Kitchen glucose blood test strip      1 each 2 (two) times daily.         Marland Kitchen HYDROcodone-acetaminophen (NORCO/VICODIN) 5-325 MG per tablet   Oral   Take 1 tablet by mouth every 4 (four) hours as needed for moderate pain.         Marland Kitchen losartan-hydrochlorothiazide (HYZAAR) 100-25 MG per tablet   Oral   Take 1 tablet by mouth at bedtime.         . MULTIPLE VITAMINS-MINERALS PO   Oral   Take 1 tablet by mouth daily.         Lebron Quam 5-325 MG per tablet   Oral   Take 1-2 tablets by mouth every 6 (six) hours as needed for moderate pain. MAXIMUM TOTAL ACETAMINOPHEN DOSE IS 4000 MG PER DAY   30 tablet   0     Dispense as written.   . pravastatin (PRAVACHOL) 20 MG tablet   Oral   Take 20 mg by mouth at bedtime.         . Psyllium (METAMUCIL MULTIHEALTH FIBER) 58.12  % PACK   Oral   Take 1 packet by mouth daily.         . Selenium 200 MCG TBCR   Oral   Take 1 tablet by mouth daily.         Marland Kitchen Specialty Vitamins Products (MAGNESIUM, AMINO ACID CHELATE,) 133 MG tablet   Oral   Take 1 tablet by mouth daily.         . vitamin B-12 (CYANOCOBALAMIN) 1000 MCG tablet   Oral   Take 1,000 mcg by mouth daily.           Allergies Review of patient's allergies indicates no known allergies.  Family History  Problem Relation Age of Onset  . Hypertension Mother   . Congestive Heart Failure Mother   . Heart attack Father     Social History History  Substance Use Topics  . Smoking status: Former Smoker -- 1.00 packs/day    Types: Cigarettes    Quit date: 10/17/1978  . Smokeless tobacco: Never Used  . Alcohol Use: No    Review of Systems Constitutional: No fever/chills Eyes: No visual changes. ENT: No sore throat. Cardiovascular: Denies chest pain. Respiratory: Denies shortness of breath. Gastrointestinal: No abdominal pain.  No nausea, no vomiting.  No diarrhea.  No constipation. Genitourinary: Negative for dysuria. Musculoskeletal: Negative for back pain. Skin: Negative for rash. Neurological: Negative for focal weakness or numbness.  10-point ROS otherwise negative.  ____________________________________________   PHYSICAL EXAM:  VITAL SIGNS: ED Triage Vitals  Enc Vitals Group     BP 12/09/14 1724 180/62 mmHg     Pulse Rate 12/09/14 1724 57     Resp 12/09/14 1724 18     Temp 12/09/14 1724 98.2 F (36.8 C)     Temp Source 12/09/14 1724 Oral     SpO2 12/09/14 1724 98 %     Weight 12/09/14 1724 169 lb (76.658 kg)     Height 12/09/14 1724 5\' 8"  (1.727 m)     Head Cir --      Peak Flow --      Pain Score 12/09/14 1725 6     Pain Loc --      Pain Edu? --      Excl. in [place]? --  Constitutional: Alert and oriented. Well appearing and in no acute distress. Eyes: Conjunctivae are normal. PERRL. EOMI. left eye 20/100 and  right eye 20/70 uncorrected. He says that he does wear reading glasses. Denies any change in his vision at this time. No obvious pallor or blood and thunder appearance to the retina bilaterally. No uptake with forcing staining bilaterally. OD 15 OS 12 pressures. Head: Atraumatic. No tenderness palpation along the distribution of the carotid and temporal arteries.  Nose: No congestion/rhinnorhea. Mouth/Throat: Mucous membranes are moist.  Oropharynx non-erythematous. Neck: No stridor.   Cardiovascular: Normal rate, regular rhythm. Grossly normal heart sounds.  Good peripheral circulation. Respiratory: Normal respiratory effort.  No retractions. Lungs CTAB. Gastrointestinal: Soft and nontender. No distention. No abdominal bruits. No CVA tenderness. Musculoskeletal: Moderate bilateral lower extremity edema which the patient has already been evaluated for..  No joint effusions. Neurologic:  Normal speech and language. No gross focal neurologic deficits are appreciated. No gait instability. Skin:  Skin is warm, dry and intact. No rash noted. Psychiatric: Mood and affect are normal. Speech and behavior are normal.  ____________________________________________   LABS (all labs ordered are listed, but only abnormal results are displayed)  Labs Reviewed  BASIC METABOLIC PANEL - Abnormal; Notable for the following:    Glucose, Bld 128 (*)    BUN 36 (*)    Creatinine, Ser 1.42 (*)    GFR calc non Af Amer 44 (*)    GFR calc Af Amer 52 (*)    All other components within normal limits  CBC - Abnormal; Notable for the following:    RBC 3.09 (*)    Hemoglobin 9.3 (*)    HCT 28.5 (*)    All other components within normal limits  SEDIMENTATION RATE   ____________________________________________  EKG  ED ECG REPORT I, Doran Stabler, the attending physician, personally viewed and interpreted this ECG.   Date: 12/09/2014  EKG Time: 1736  Rate: 55  Rhythm: sinus bradycardia  Axis: Normal  axis  Intervals:none  ST&T Change: No ST elevations or depressions. No abnormal T-wave inversions.  ____________________________________________  RADIOLOGY  CAT scan without acute intracranial findings. Minimal chronic ischemic microvascular disease and small old lacunar infarcts. ____________________________________________   PROCEDURES   ____________________________________________   INITIAL IMPRESSION / ASSESSMENT AND PLAN / ED COURSE  Pertinent labs & imaging results that were available during my care of the patient were reviewed by me and considered in my medical decision making (see chart for details).  ----------------------------------------- 10:10 PM on 12/09/2014 -----------------------------------------  Patient continues to be pain-free. No further blurred vision. Sedimentation rate of 1. Unlikely to be temporal arteritis. We'll discharge to home. Likely muscle spasm versus tension headache. Has follow-up with ophthalmology later this month. ____________________________________________   FINAL CLINICAL IMPRESSION(S) / ED DIAGNOSES  Final diagnoses:  Nonintractable headache, unspecified chronicity pattern, unspecified headache type      Orbie Pyo, MD 12/09/14 2210

## 2014-12-09 NOTE — ED Notes (Signed)

## 2014-12-09 NOTE — ED Notes (Signed)
Patient present to ED with complaint of pain behind right ear radiating to right eye. Patient reports had blurry vision with pain. States pain behind right ear has subsided but pain in eye is still present. Patient alert and oriented x4, call bell within reach, family at bedside. NAD noted at this time.

## 2014-12-09 NOTE — ED Notes (Signed)
Patient provided with [place] sandwich and a drink per verbal order of Dr. Clearnce Hasten.

## 2014-12-09 NOTE — ED Notes (Addendum)
Pt states he was sitting watching TV and about 1700 he started having sharp pain in right side of head behind right ear and right side of neck, also c/o pain in right eye, denies any blurred vision, states pain has been constant since, denies any n,v, seen in ED last night for lower extremity edema

## 2014-12-09 NOTE — ED Notes (Signed)
Dr. Schaevitz at bedside.  

## 2014-12-11 DIAGNOSIS — M25473 Effusion, unspecified ankle: Secondary | ICD-10-CM | POA: Insufficient documentation

## 2014-12-17 ENCOUNTER — Encounter: Payer: Self-pay | Admitting: *Deleted

## 2014-12-18 ENCOUNTER — Ambulatory Visit (INDEPENDENT_AMBULATORY_CARE_PROVIDER_SITE_OTHER): Payer: Medicare Other | Admitting: Urology

## 2014-12-18 VITALS — BP 134/63 | HR 63 | Ht 68.5 in | Wt 170.4 lb

## 2014-12-18 DIAGNOSIS — C61 Malignant neoplasm of prostate: Secondary | ICD-10-CM | POA: Diagnosis not present

## 2014-12-18 DIAGNOSIS — R32 Unspecified urinary incontinence: Secondary | ICD-10-CM | POA: Diagnosis not present

## 2014-12-18 LAB — URINALYSIS, COMPLETE
Bilirubin, UA: NEGATIVE
GLUCOSE, UA: NEGATIVE
KETONES UA: NEGATIVE
Leukocytes, UA: NEGATIVE
Nitrite, UA: NEGATIVE
PH UA: 5 (ref 5.0–7.5)
PROTEIN UA: NEGATIVE
RBC UA: NEGATIVE
Specific Gravity, UA: 1.015 (ref 1.005–1.030)
Urobilinogen, Ur: 0.2 mg/dL (ref 0.2–1.0)

## 2014-12-18 LAB — MICROSCOPIC EXAMINATION
BACTERIA UA: NONE SEEN
RBC MICROSCOPIC, UA: NONE SEEN /HPF (ref 0–?)

## 2014-12-18 LAB — BLADDER SCAN AMB NON-IMAGING: Scan Result: 69

## 2014-12-18 MED ORDER — LEUPROLIDE ACETATE (6 MONTH) 45 MG IM KIT: 45.0000 mg | PACK | INTRAMUSCULAR | Status: DC

## 2014-12-18 MED ORDER — LEUPROLIDE ACETATE (6 MONTH) 45 MG IM KIT
45.0000 mg | PACK | Freq: Once | INTRAMUSCULAR | Status: AC
  Administered 2014-12-18: 45 mg via INTRAMUSCULAR

## 2014-12-18 NOTE — Progress Notes (Signed)
12/18/2014 8:59 AM   Shawn Mendez 07/30/1930 [id number]  Referring provider: Glean Hess, MD [address] [place], [place] [address]  Chief Complaint  Patient presents with  . Urinary Incontinence    6 mth f/u prostate cancer, incontinence    HPI: Shawn Mendez is an 81 year old white male with prostate cancer and incontinence who presents today for one year follow up.  Prostate cancer: Patient had a RRP in 1994 for an unknown Gleason's score. His PSA has been rising and he was started on ADT therapy.  PSA History:     02/26/2011- 2.57 ng/mL     11/30/2010- 6.46 ng/mL     06/16/2011- 7.39 ng/mL     01/04/2012- 8.72 ng/mL     07/04/2012- 11.09 ng/mL     08/16/2013- 20.1 ng/mL - started Firmagon     11/19/2013 - <0.1 ng/mL - changed to Lupron        06/19/2014 - <0.1 ng/mL on 06/19/2014  He is experiencing incontinence and ED.   He denies any weight loss, bone pain or appetite loss.  He is receiving ADT therapy in the form of 6 month Lupron injections.   He is not experiencing hot flashes. He is not experiencing pain in the injection site. He is taking his vitamin D thousand units daily and calcium in the form of a multivitamin.  Incontinenecne: His IPSS score today is 9, which is moderate lower urinary tract symptomatology. He is mostly with his quality life due to his urinary symptoms. His PVR is 69  mL.    His major complaint today incontinence.  He has had these symptoms for secveral  years.  He feels the incontinence is minimal.  He did not find the tamsulosin helpful, so he has discontinued that medication. He is currently managing his incontinence with incontinence pads. He denies any dysuria, hematuria or suprapubic pain.      He also denies any recent fevers, chills, nausea or vomiting.        IPSS      12/18/14 1100       International Prostate Symptom Score   How often have you had the sensation of not emptying your bladder? Not at All      How often have you had to urinate less than every two hours? Less than half the time     How often have you found you stopped and started again several times when you urinated? Less than half the time     How often have you found it difficult to postpone urination? Not at All     How often have you had a weak urinary stream? About half the time     How often have you had to strain to start urination? Not at All     How many times did you typically get up at night to urinate? 2 Times     Total IPSS Score 9     Quality of Life due to urinary symptoms   If you were to spend the rest of your life with your urinary condition just the way it is now how would you feel about that? Mostly Satisfied        Score:  1-7 Mild 8-19 Moderate 20-35 Severe   PMH: Past Medical History  Diagnosis Date  . Hypertension   . Diabetes mellitus without complication   . Chronic kidney disease     Stage 3 renal failure  . GERD (gastroesophageal reflux  disease)   . History of hiatal hernia   . Arthritis   . Cancer     prostate  . Anemia   . Diverticulosis   . COPD (chronic obstructive pulmonary disease)     previous smoker  . Hyperlipidemia   . CHF (congestive heart failure)     [facility]  . HLD (hyperlipidemia)   . Incontinence   . Urinary frequency   . Elevated PSA   . Incomplete bladder emptying     Surgical History: Past Surgical History  Procedure Laterality Date  . Back surgery  1972    Fusion  . Hernia repair Bilateral 1980    inguinal hernia repair  . Prostatectomy  1995  . Joint replacement Right     total knee replacement  . Right total knee replacement    . Hammer toe surgery Right   . Septoplasty    . Eye surgery Bilateral     cataract extraction  . Cholecystectomy  2000  . Carpal tunnel release Bilateral 10/23/2014    Procedure: CARPAL TUNNEL RELEASE;  Surgeon: Leanor Kail, MD;  Location: [facility] ORS;  Service: Orthopedics;  Laterality: Bilateral;     Home Medications:    Medication List       This list is accurate as of: 12/18/14 11:59 PM.  Always use your most recent med list.               BLOOD GLUCOSE TEST VI  1 each by In Vitro route 2 (two) times daily.     glucose blood test strip  1 each 2 (two) times daily.     CoQ10 100 MG Caps  Take by mouth.     folic acid 932 MCG tablet  Commonly known as:  FOLVITE  Take 800 mcg by mouth daily.     furosemide 20 MG tablet  Commonly known as:  LASIX  Take by mouth.     gabapentin 100 MG capsule  Commonly known as:  NEURONTIN  Take by mouth.     glimepiride 1 MG tablet  Commonly known as:  AMARYL  TAKE ONE-HALF (1/2) TABLET DAILY     HYDROcodone-acetaminophen 5-325 MG per tablet  Commonly known as:  NORCO/VICODIN  Take 1 tablet by mouth every 4 (four) hours as needed for moderate pain.     NORCO 5-325 MG per tablet  Generic drug:  HYDROcodone-acetaminophen  Take 1-2 tablets by mouth every 6 (six) hours as needed for moderate pain. MAXIMUM TOTAL ACETAMINOPHEN DOSE IS 4000 MG PER DAY     losartan-hydrochlorothiazide 100-25 MG per tablet  Commonly known as:  HYZAAR  Take 1 tablet by mouth at bedtime.     magnesium (amino acid chelate) 133 MG tablet  Take 1 tablet by mouth daily.     METAMUCIL MULTIHEALTH FIBER 58.12 % Pack  Generic drug:  Psyllium  Take 1 packet by mouth daily.     multivitamins with iron Tabs tablet  Take by mouth.     pravastatin 20 MG tablet  Commonly known as:  PRAVACHOL  Take 20 mg by mouth at bedtime.     RA NAPROXEN SODIUM 220 MG tablet  Generic drug:  naproxen sodium  Take by mouth.     Selenium 200 MCG Tbcr  Take 1 tablet by mouth daily.     vitamin B-12 1000 MCG tablet  Commonly known as:  CYANOCOBALAMIN  Take 1,000 mcg by mouth daily.     Vitamin D3 1000 UNITS  Caps  Take by mouth.        Allergies: No Known Allergies  Family History: Family History  Problem Relation Age of Onset  . Hypertension Mother   .  Congestive Heart Failure Mother   . Heart attack Father   . Breast cancer Sister   . Prostate cancer Maternal Grandfather   . Diabetes type II    . Coronary artery disease Father     Social History:  reports that he quit smoking about 36 years ago. His smoking use included Cigarettes. He smoked 1.00 pack per day. He has never used smokeless tobacco. He reports that he does not drink alcohol or use illicit drugs.  ROS: UROLOGY Frequent Urination?: No Hard to postpone urination?: No Burning/pain with urination?: No Get up at night to urinate?: Yes Leakage of urine?: Yes Urine stream starts and stops?: No Trouble starting stream?: No Do you have to strain to urinate?: No Blood in urine?: No Urinary tract infection?: No Sexually transmitted disease?: No Injury to kidneys or bladder?: No Painful intercourse?: No Weak stream?: No Erection problems?: No Penile pain?: No  Gastrointestinal Nausea?: No Vomiting?: No Indigestion/heartburn?: No Diarrhea?: No Constipation?: No  Constitutional Fever: No Night sweats?: No Weight loss?: No Fatigue?: No  Skin Skin rash/lesions?: No Itching?: No  Eyes Blurred vision?: No Double vision?: No  Ears/Nose/Throat Sore throat?: No Sinus problems?: No  Hematologic/Lymphatic Swollen glands?: No Easy bruising?: No  Cardiovascular Leg swelling?: No Chest pain?: No  Respiratory Cough?: No Shortness of breath?: No  Endocrine Excessive thirst?: No  Musculoskeletal Back pain?: No Joint pain?: No  Neurological Headaches?: No Dizziness?: No  Psychologic Depression?: No Anxiety?: No  Physical Exam: BP 134/63 mmHg  Pulse 63  Ht 5' 8.5" (1.74 m)  Wt 170 lb 6.4 oz (77.293 kg)  BMI 25.53 kg/m2  GU: Patient with circumcised phallus.   Urethral meatus is patent.  No penile discharge. No penile lesions or rashes. Scrotum without lesions, cysts, rashes and/or edema.  Testicles are located scrotally bilaterally. No masses  are appreciated in the testicles. Left and right epididymis are normal. Rectal: Patient with  normal sphincter tone. Perineum without scarring or rashes. No rectal masses are appreciated. Rectal vault is empty.    Laboratory Data: Results for orders placed or performed in visit on 12/18/14  Microscopic Examination  Result Value Ref Range   WBC, UA 0-5 0 -  5 /hpf   RBC, UA None seen 0 -  2 /hpf   Epithelial Cells (non renal) 0-10 0 - 10 /hpf   Casts Present (A) None seen /lpf   Cast Type Hyaline casts N/A   Bacteria, UA None seen None seen/Few  Urinalysis, Complete  Result Value Ref Range   Specific Gravity, UA 1.015 1.005 - 1.030   pH, UA 5.0 5.0 - 7.5   Color, UA Yellow Yellow   Appearance Ur Clear Clear   Leukocytes, UA Negative Negative   Protein, UA Negative Negative/Trace   Glucose, UA Negative Negative   Ketones, UA Negative Negative   RBC, UA Negative Negative   Bilirubin, UA Negative Negative   Urobilinogen, Ur 0.2 0.2 - 1.0 mg/dL   Nitrite, UA Negative Negative   Microscopic Examination See below:   PSA  Result Value Ref Range   Prostate Specific Ag, Serum <0.1 0.0 - 4.0 ng/mL  Bladder Scan (Post Void Residual) in office  Result Value Ref Range   Scan Result 69     Lab Results  Component  Value Date   WBC 6.9 12/09/2014   HGB 9.3* 12/09/2014   HCT 28.5* 12/09/2014   MCV 92.4 12/09/2014   PLT 260 12/09/2014    Lab Results  Component Value Date   CREATININE 1.42* 12/09/2014    Lab Results  Component Value Date   PSA <0.1 12/18/2014    No results found for: TESTOSTERONE  No results found for: HGBA1C  Urinalysis    Component Value Date/Time   COLORURINE Straw 01/10/2014 0416   APPEARANCEUR Clear 01/10/2014 0416   LABSPEC 1.008 01/10/2014 0416   PHURINE 5.0 01/10/2014 0416   GLUCOSEU Negative 12/18/2014 1030   GLUCOSEU Negative 01/10/2014 0416   HGBUR Negative 01/10/2014 0416   BILIRUBINUR Negative 12/18/2014 1030   BILIRUBINUR Negative  01/10/2014 0416   KETONESUR Negative 01/10/2014 0416   PROTEINUR Negative 01/10/2014 0416   NITRITE Negative 12/18/2014 1030   NITRITE Negative 01/10/2014 0416   LEUKOCYTESUR Negative 12/18/2014 1030   LEUKOCYTESUR Negative 01/10/2014 0416    Pertinent Imaging:   Assessment & Plan:     1.  CA of prostate:  Patient had a RRP in 1994 for an unknown Gleason's score. His PSA has been rising and he was started on ADT therapy.  PSA History:     02/26/2011- 2.57 ng/mL     11/30/2010- 6.46 ng/mL     06/16/2011- 7.39 ng/mL     01/04/2012- 8.72 ng/mL     07/04/2012- 11.09 ng/mL     08/16/2013- 20.1 ng/mL - started Firmagon     11/19/2013 - <0.1 ng/mL - changed to Lupron        06/19/2014 - <0.1 ng/mL on 06/19/2014  Patient did receive his 6 month Lupron injection today. He is to follow-up in 6 months time for Lupron injection, PSA, IPSS score and PVR.  - PSA  2. Incontinence:   Patient does not finding the incontinence bothersome. He is currently managing it through wearing incontinence pads. He does not desire any further evaluation or workup concerning this issue at this time.  - Urinalysis, Complete - Bladder Scan (Post Void Residual) in office   Return in about 6 months (around 06/20/2015) for LUPRON, IPSS and PVR. PSA to be drawn before appointment   Zara Council, [place] [address] [place], [place] [address] [phone]

## 2014-12-18 NOTE — Progress Notes (Signed)
Lupron IM Injection   Due to Prostate Cancer patient is present today for a Lupron Injection.  Medication: Lupron 6 month Dose: 45 mg  Location: right upper outer buttocks Lot: [id number]  Exp: 04/13/2016  Patient tolerated well, no complications were noted  Performed by: K.Russell,CMA  Follow up: 6 mths

## 2014-12-19 ENCOUNTER — Encounter: Payer: Self-pay | Admitting: Urology

## 2014-12-19 ENCOUNTER — Telehealth: Payer: Self-pay

## 2014-12-19 DIAGNOSIS — R32 Unspecified urinary incontinence: Secondary | ICD-10-CM | POA: Insufficient documentation

## 2014-12-19 DIAGNOSIS — R972 Elevated prostate specific antigen [PSA]: Secondary | ICD-10-CM

## 2014-12-19 LAB — PSA

## 2014-12-19 NOTE — Telephone Encounter (Signed)
Spoke with pt and made aware of lab results. Pt voiced understanding. Orders have been placed.

## 2014-12-19 NOTE — Telephone Encounter (Signed)
-----   Message from Nori Riis, PA-C sent at 12/19/2014 12:51 PM EDT ----- Patient's PSA is stable.  We will see him in 6 months.  PSA to be drawn before his next appointment.

## 2015-01-24 ENCOUNTER — Other Ambulatory Visit: Payer: Self-pay

## 2015-03-11 ENCOUNTER — Encounter: Payer: Self-pay | Admitting: Internal Medicine

## 2015-03-11 DIAGNOSIS — E119 Type 2 diabetes mellitus without complications: Secondary | ICD-10-CM | POA: Insufficient documentation

## 2015-03-27 DIAGNOSIS — I5022 Chronic systolic (congestive) heart failure: Secondary | ICD-10-CM | POA: Insufficient documentation

## 2015-04-25 ENCOUNTER — Ambulatory Visit (INDEPENDENT_AMBULATORY_CARE_PROVIDER_SITE_OTHER): Payer: Medicare Other | Admitting: Internal Medicine

## 2015-04-25 ENCOUNTER — Encounter: Payer: Self-pay | Admitting: Internal Medicine

## 2015-04-25 VITALS — BP 150/86 | HR 60 | Ht 68.5 in | Wt 175.0 lb

## 2015-04-25 DIAGNOSIS — I5022 Chronic systolic (congestive) heart failure: Secondary | ICD-10-CM

## 2015-04-25 DIAGNOSIS — E1169 Type 2 diabetes mellitus with other specified complication: Secondary | ICD-10-CM

## 2015-04-25 DIAGNOSIS — M25473 Effusion, unspecified ankle: Secondary | ICD-10-CM

## 2015-04-25 DIAGNOSIS — R609 Edema, unspecified: Secondary | ICD-10-CM

## 2015-04-25 DIAGNOSIS — I10 Essential (primary) hypertension: Secondary | ICD-10-CM | POA: Diagnosis not present

## 2015-04-25 DIAGNOSIS — E1122 Type 2 diabetes mellitus with diabetic chronic kidney disease: Secondary | ICD-10-CM

## 2015-04-25 DIAGNOSIS — E782 Mixed hyperlipidemia: Secondary | ICD-10-CM

## 2015-04-25 DIAGNOSIS — N184 Chronic kidney disease, stage 4 (severe): Secondary | ICD-10-CM | POA: Diagnosis not present

## 2015-04-25 DIAGNOSIS — Z23 Encounter for immunization: Secondary | ICD-10-CM | POA: Diagnosis not present

## 2015-04-25 DIAGNOSIS — N183 Chronic kidney disease, stage 3 unspecified: Secondary | ICD-10-CM

## 2015-04-25 MED ORDER — FUROSEMIDE 20 MG PO TABS: 20.0000 mg | ORAL_TABLET | Freq: Every day | ORAL | Status: DC

## 2015-04-25 NOTE — Patient Instructions (Signed)
Pneumococcal Conjugate Vaccine (PCV13)   1. Why get vaccinated?  Vaccination can protect both children and adults from pneumococcal disease.  Pneumococcal disease is caused by bacteria that can spread from person to person through close contact. It can cause ear infections, and it can also lead to more serious infections of the:  · Lungs (pneumonia),  · Blood (bacteremia), and  · Covering of the brain and spinal cord (meningitis).  Pneumococcal pneumonia is most common among adults. Pneumococcal meningitis can cause deafness and brain damage, and it kills about 1 child in 10 who get it.  Anyone can get pneumococcal disease, but children under 2 years of age and adults 65 years and older, people with certain medical conditions, and cigarette smokers are at the highest risk.  Before there was a vaccine, the [place] saw:  · more than 700 cases of meningitis,  · about 13,000 blood infections,  · about 5 million ear infections, and  · about 200 deaths  in children under 5 each year from pneumococcal disease. Since vaccine became available, severe pneumococcal disease in these children has fallen by 88%.  About 18,000 older adults die of pneumococcal disease each year in the [place].  Treatment of pneumococcal infections with penicillin and other drugs is not as effective as it used to be, because some strains of the disease have become resistant to these drugs. This makes prevention of the disease, through vaccination, even more important.  2. PCV13 vaccine  Pneumococcal conjugate vaccine (called PCV13) protects against 13 types of pneumococcal bacteria.  PCV13 is routinely given to children at 2, 4, 6, and 12-15 months of age. It is also recommended for children and adults 2 to 64 years of age with certain health conditions, and for all adults 65 years of age and older. Your doctor can give you details.  3. Some people should not get this vaccine  Anyone who has ever had a life-threatening allergic reaction  to a dose of this vaccine, to an earlier pneumococcal vaccine called PCV7, or to any vaccine containing diphtheria toxoid (for example, DTaP), should not get PCV13.  Anyone with a severe allergy to any component of PCV13 should not get the vaccine. Tell your doctor if the person being vaccinated has any severe allergies.  If the person scheduled for vaccination is not feeling well, your healthcare provider might decide to reschedule the shot on another day.  4. Risks of a vaccine reaction  With any medicine, including vaccines, there is a chance of reactions. These are usually mild and go away on their own, but serious reactions are also possible.  Problems reported following PCV13 varied by age and dose in the series. The most common problems reported among children were:  · About half became drowsy after the shot, had a temporary loss of appetite, or had redness or tenderness where the shot was given.  · About 1 out of 3 had swelling where the shot was given.  · About 1 out of 3 had a mild fever, and about 1 in 20 had a fever over 102.2°F.  · Up to about 8 out of 10 became fussy or irritable.  Adults have reported pain, redness, and swelling where the shot was given; also mild fever, fatigue, headache, chills, or muscle pain.  Young children who get PCV13 along with inactivated flu vaccine at the same time may be at increased risk for seizures caused by fever. Ask your doctor for more information.  Problems that   could happen after any vaccine:  · People sometimes faint after a medical procedure, including vaccination. Sitting or lying down for about 15 minutes can help prevent fainting, and injuries caused by a fall. Tell your doctor if you feel dizzy, or have vision changes or ringing in the ears.  · Some older children and adults get severe pain in the shoulder and have difficulty moving the arm where a shot was given. This happens very rarely.  · Any medication can cause a severe allergic reaction. Such  reactions from a vaccine are very rare, estimated at about 1 in a million doses, and would happen within a few minutes to a few hours after the vaccination.  As with any medicine, there is a very small chance of a vaccine causing a serious injury or death.  The safety of vaccines is always being monitored. For more information, visit: www.cdc.gov/vaccinesafety/  5. What if there is a serious reaction?  What should I look for?  · Look for anything that concerns you, such as signs of a severe allergic reaction, very high fever, or unusual behavior.  Signs of a severe allergic reaction can include hives, swelling of the face and throat, difficulty breathing, a fast heartbeat, dizziness, and weakness-usually within a few minutes to a few hours after the vaccination.  What should I do?  · If you think it is a severe allergic reaction or other emergency that can't wait, call 9-1-1 or get the person to the nearest hospital. Otherwise, call your doctor.  Reactions should be reported to the Vaccine Adverse Event Reporting System (VAERS). Your doctor should file this report, or you can do it yourself through the VAERS web site at www.vaers.hhs.gov, or by calling 1-800-822-7967.  VAERS does not give medical advice.  6. The National Vaccine Injury Compensation Program  The National Vaccine Injury Compensation Program (VICP) is a federal program that was created to compensate people who may have been injured by certain vaccines.  Persons who believe they may have been injured by a vaccine can learn about the program and about filing a claim by calling 1-800-338-2382 or visiting the VICP website at www.hrsa.gov/vaccinecompensation. There is a time limit to file a claim for compensation.  7. How can I learn more?  · Ask your healthcare provider. He or she can give you the vaccine package insert or suggest other sources of information.  · Call your local or state health department.  · Contact the Centers for Disease Control and  Prevention (CDC):    Call 1-800-232-4636 (1-800-CDC-INFO) or    Visit CDC's website at www.cdc.gov/vaccines  Vaccine Information Statement  PCV13 Vaccine (03/21/2014)     This information is not intended to replace advice given to you by your health care provider. Make sure you discuss any questions you have with your health care provider.     Document Released: 02/28/2006 Document Revised: 05/24/2014 Document Reviewed: 03/28/2014  Elsevier Interactive Patient Education ©2016 [employer]

## 2015-04-25 NOTE — Progress Notes (Signed)
Date:  04/25/2015   Name:  Shawn Mendez   DOB:  Mar 30, 1931   MRN:  [id number]   Chief Complaint: Chronic Kidney Disease and Diabetes Diabetes He presents for his follow-up diabetic visit. He has type 2 diabetes mellitus. His disease course has been stable. There are no hypoglycemic associated symptoms. Pertinent negatives for hypoglycemia include no dizziness, headaches, pallor or seizures. Pertinent negatives for diabetes include no chest pain and no fatigue. Current diabetic treatment includes oral agent (monotherapy). He is compliant with treatment all of the time. He is following a generally healthy diet. He participates in exercise three times a week. His breakfast blood glucose is taken between 6-7 am. His breakfast blood glucose range is generally 90-110 mg/dl. An ACE inhibitor/angiotensin II receptor blocker is being taken. Eye exam is current.  Hypertension This is a chronic problem. The current episode started more than 1 year ago. The problem is controlled. Pertinent negatives include no chest pain, headaches, palpitations or shortness of breath. Hypertensive end-organ damage includes kidney disease and CAD/MI (mild CHF - EF 51% on nuclear stress test - followed by Cardiology).   Chronic Renal insufficiency -followed by Nephrology.  GFR around 44. He reports mild chronic ankle edema controlled by Lasix. He denies itching, orthopnea or wheezing. He does have some increase in shortness of breath and decreased exercise tolerance.  Review of Systems  Constitutional: Negative for chills, fatigue and unexpected weight change.  HENT: Positive for hearing loss.   Eyes: Negative for visual disturbance.  Respiratory: Negative for cough, chest tightness and shortness of breath.   Cardiovascular: Positive for leg swelling. Negative for chest pain and palpitations.  Gastrointestinal: Negative for abdominal pain.  Genitourinary: Negative for dysuria and frequency.  Musculoskeletal: Positive for  arthralgias.  Skin: Negative for pallor.  Neurological: Negative for dizziness, seizures and headaches.   1. AV ETT 2. Normal left ventricular function 3. Normal wall motion 4. Mild inferior scar without evidence for significant ischemia   Result Narrative  Procedure: Exercise Myocardial Perfusion Imaging   ONE day procedure  Indication: Chronic systolic CHF (congestive heart failure), NYHA class 2  - Plan: NM myocardial perfusion SPECT multiple (stress and rest), ECG  stress test only  SOB (shortness of breath) on exertion - Plan: NM myocardial perfusion  SPECT multiple (stress and rest), ECG stress test only Ordering Physician:   Dr. Isaias Cowman   Clinical History: 81 y.o. year old male Vitals: Height: 69 in  Weight: 175 lb Cardiac risk factors include:    CHF, Hyperlipidemia, Diabetes, HTN and CAD    Procedure: The patient performed treadmill exercise using a Bruce protocol for 6:00  minutes. The exercise test was stopped due to SOB/fatigue.  Blood pressure  response was normal.   Rest HR: 54bpm Rest BP: 142/22mmHg Max HR: 105bpm Max BP: 168/54mmHg Mets:     7.00 % MAX HR:   76%  Stress Test Administered by: Oswald Hillock, CMA  ECG Interpretation: Rest ECG:  normal sinus rhythm, none Stress ECG:  normal sinus rhythm,  Recovery ECG:  normal sinus rhythm ECG Interpretation:  negative, nondiagnostic changes.   Administrations This Visit    technetium Tc76m sestamibi (CARDIOLITE) injection 123456 millicurie    Admin Date Action Dose Route Administered By      [id number] Given 123456 millicurie Intravenous Rennie Natter, CNMT            technetium Tc65m sestamibi (CARDIOLITE) injection Q000111Q millicurie    Admin Date Action Dose Route Administered By  04/02/2015 Given Q000111Q millicurie Intravenous Joshua B Hyler, CNMT              Gated post-stress perfusion imaging was performed 30 minutes after stress.  Rest images were performed 30  minutes after injection.  Gated LV Analysis:  TID:    LVEF= 51%  FINDINGS: Regional wall motion:  reveals normal myocardial thickening and wall  motion. The overall quality of the study is good.   Artifacts noted: no Left ventricular cavity: normal.  Perfusion Analysis:  SPECT images demonstrate small perfusion abnormality  of mild intensity is present in the inferior region on the stress images.      Patient Active Problem List   Diagnosis Date Noted  . Chronic systolic heart failure ([place]) 03/27/2015  . Incontinence 12/19/2014  . Ankle edema 12/11/2014  . LBP (low back pain) 12/03/2014  . Cervical pain 12/03/2014  . Carpal tunnel syndrome 10/31/2014  . Chronic kidney disease, stage IV (severe) ([place]) 10/25/2014  . Calcification of coronary artery 10/25/2014  . Essential (primary) hypertension 10/25/2014  . Mixed hyperlipidemia due to type 2 diabetes mellitus ([place]) 10/25/2014  . CA of prostate ([place]) 10/25/2014  . Gastroduodenal ulcer 10/25/2014  . Purpura senilis ([place]) 10/25/2014  . Type 2 diabetes, controlled, with renal manifestation ([place]) 10/25/2014  . Atherosclerosis of coronary artery 10/25/2014  . Diverticulitis of colon 10/25/2014  . Non-thrombocytopenic purpura ([place]) 10/25/2014  . Abnormal CAT scan 09/17/2013  . Abnormal finding on radiology exam 09/17/2013    Prior to Admission medications   Medication Sig Start Date End Date Taking? Authorizing Provider  Cholecalciferol (VITAMIN D3) 1000 UNITS CAPS Take by mouth.   Yes Historical Provider, MD  Coenzyme Q10 (COQ10) 100 MG CAPS Take by mouth.   Yes Historical Provider, MD  folic acid (FOLVITE) Q000111Q MCG tablet Take 800 mcg by mouth daily.   Yes Historical Provider, MD  furosemide (LASIX) 20 MG tablet Take by mouth. 12/11/14 12/11/15 Yes Historical Provider, MD  gabapentin (NEURONTIN) 100 MG capsule Take by mouth.   Yes Historical Provider, MD  glimepiride (AMARYL) 1 MG tablet TAKE ONE-HALF (1/2) TABLET DAILY 11/07/14   Yes Glean Hess, MD  Glucose Blood (BLOOD GLUCOSE TEST VI) 1 each by In Vitro route 2 (two) times daily. 12/24/13  Yes Historical Provider, MD  glucose blood test strip 1 each 2 (two) times daily. 12/24/13  Yes Historical Provider, MD  losartan-hydrochlorothiazide (HYZAAR) 100-25 MG per tablet Take 1 tablet by mouth at bedtime.   Yes Historical Provider, MD  Multiple Vitamins-Iron (MULTIVITAMINS WITH IRON) TABS tablet Take by mouth.   Yes Historical Provider, MD  naproxen sodium (RA NAPROXEN SODIUM) 220 MG tablet Take by mouth.   Yes Historical Provider, MD  pravastatin (PRAVACHOL) 20 MG tablet Take 20 mg by mouth at bedtime.   Yes Historical Provider, MD  Psyllium (METAMUCIL MULTIHEALTH FIBER) 58.12 % PACK Take 1 packet by mouth daily.   Yes Historical Provider, MD  Specialty Vitamins Products (MAGNESIUM, AMINO ACID CHELATE,) 133 MG tablet Take 1 tablet by mouth daily.   Yes Historical Provider, MD  vitamin B-12 (CYANOCOBALAMIN) 1000 MCG tablet Take 1,000 mcg by mouth daily.   Yes Historical Provider, MD    No Known Allergies  Past Surgical History  Procedure Laterality Date  . Back surgery  1972    Fusion  . Hernia repair Bilateral 1980    inguinal hernia repair  . Prostatectomy  1995  . Joint replacement Right     total knee  replacement  . Right total knee replacement    . Hammer toe surgery Right   . Septoplasty    . Eye surgery Bilateral     cataract extraction  . Cholecystectomy  2000  . Carpal tunnel release Bilateral 10/23/2014    Procedure: CARPAL TUNNEL RELEASE;  Surgeon: Leanor Kail, MD;  Location: [facility] ORS;  Service: Orthopedics;  Laterality: Bilateral;    Social History  Substance Use Topics  . Smoking status: Former Smoker -- 1.00 packs/day    Types: Cigarettes    Quit date: 10/17/1978  . Smokeless tobacco: Never Used  . Alcohol Use: No    Medication list has been reviewed and updated.   Physical Exam  Constitutional: He is oriented to person, place,  and time. He appears well-developed. No distress.  HENT:  Head: Normocephalic and atraumatic.  Eyes: Conjunctivae are normal. Right eye exhibits no discharge. Left eye exhibits no discharge. No scleral icterus.  Neck: Normal range of motion. Neck supple. No thyromegaly present.  Cardiovascular: Normal rate, regular rhythm, normal heart sounds and normal pulses.   Pulmonary/Chest: Effort normal and breath sounds normal. No respiratory distress. He has no wheezes. He has no rales.  Musculoskeletal: Normal range of motion. He exhibits edema. He exhibits no tenderness.  Neurological: He is alert and oriented to person, place, and time.  Skin: Skin is warm and dry. No rash noted.     Psychiatric: He has a normal mood and affect. His behavior is normal. Thought content normal.    BP 180/76 mmHg  Pulse 60  Ht 5' 8.5" (1.74 m)  Wt 175 lb (79.379 kg)  BMI 26.22 kg/m2  Assessment and Plan: 1. Controlled type 2 diabetes mellitus with stage 3 chronic kidney disease, without long-term current use of insulin (HCC) Continue oral agents and healthy diet with exercise - Hemoglobin A1c  2. Chronic kidney disease, stage IV (severe) ([place]) Obtain labs today Followed by [facility] nephrology at [place] metabolic panel  3. Mixed hyperlipidemia due to type 2 diabetes mellitus (HCC) Continue statin therapy - Lipid panel  4. Essential (primary) hypertension Fair control on oral agents - CBC with Differential/Platelet  5. Need for pneumococcal vaccination - Pneumococcal conjugate vaccine 13-valent  6. Ankle edema Continue daily Lasix and sodium restriction Follow-up with cardiology as planned - furosemide (LASIX) 20 MG tablet; Take 1 tablet (20 mg total) by mouth daily.  Dispense: 90 tablet; Refill: 1 - TSH  7. Chronic systolic heart failure (HCC) Recent stress test showed EF of 51% and no acute ischemia   [name], MD [facility]  04/25/2015

## 2015-04-26 LAB — COMPREHENSIVE METABOLIC PANEL WITH GFR
ALT: 10 IU/L (ref 0–44)
AST: 16 IU/L (ref 0–40)
Albumin/Globulin Ratio: 1.6 (ref 1.1–2.5)
Albumin: 4.1 g/dL (ref 3.5–4.7)
Alkaline Phosphatase: 67 IU/L (ref 39–117)
BUN/Creatinine Ratio: 30 — ABNORMAL HIGH (ref 10–22)
BUN: 43 mg/dL — ABNORMAL HIGH (ref 8–27)
Bilirubin Total: 0.2 mg/dL (ref 0.0–1.2)
CO2: 22 mmol/L (ref 18–29)
Calcium: 9.4 mg/dL (ref 8.6–10.2)
Chloride: 103 mmol/L (ref 97–106)
Creatinine, Ser: 1.44 mg/dL — ABNORMAL HIGH (ref 0.76–1.27)
GFR calc Af Amer: 52 mL/min/1.73 — ABNORMAL LOW
GFR calc non Af Amer: 45 mL/min/1.73 — ABNORMAL LOW
Globulin, Total: 2.5 g/dL (ref 1.5–4.5)
Glucose: 113 mg/dL — ABNORMAL HIGH (ref 65–99)
Potassium: 4.7 mmol/L (ref 3.5–5.2)
Sodium: 142 mmol/L (ref 136–144)
Total Protein: 6.6 g/dL (ref 6.0–8.5)

## 2015-04-26 LAB — CBC WITH DIFFERENTIAL/PLATELET
Basophils Absolute: 0.1 x10E3/uL (ref 0.0–0.2)
Basos: 1 %
EOS (ABSOLUTE): 0.4 x10E3/uL (ref 0.0–0.4)
Eos: 4 %
Hematocrit: 32 % — ABNORMAL LOW (ref 37.5–51.0)
Hemoglobin: 10.5 g/dL — ABNORMAL LOW (ref 12.6–17.7)
Immature Grans (Abs): 0 x10E3/uL (ref 0.0–0.1)
Immature Granulocytes: 0 %
Lymphocytes Absolute: 1.5 x10E3/uL (ref 0.7–3.1)
Lymphs: 17 %
MCH: 30.5 pg (ref 26.6–33.0)
MCHC: 32.8 g/dL (ref 31.5–35.7)
MCV: 93 fL (ref 79–97)
Monocytes Absolute: 0.6 x10E3/uL (ref 0.1–0.9)
Monocytes: 7 %
Neutrophils Absolute: 6.2 x10E3/uL (ref 1.4–7.0)
Neutrophils: 71 %
Platelets: 243 x10E3/uL (ref 150–379)
RBC: 3.44 x10E6/uL — ABNORMAL LOW (ref 4.14–5.80)
RDW: 14.9 % (ref 12.3–15.4)
WBC: 8.6 x10E3/uL (ref 3.4–10.8)

## 2015-04-26 LAB — TSH: TSH: 2.6 u[IU]/mL (ref 0.450–4.500)

## 2015-04-26 LAB — LIPID PANEL
Chol/HDL Ratio: 2.1 ratio (ref 0.0–5.0)
Cholesterol, Total: 148 mg/dL (ref 100–199)
HDL: 69 mg/dL
LDL Calculated: 66 mg/dL (ref 0–99)
Triglycerides: 63 mg/dL (ref 0–149)
VLDL Cholesterol Cal: 13 mg/dL (ref 5–40)

## 2015-04-26 LAB — HEMOGLOBIN A1C
Est. average glucose Bld gHb Est-mCnc: 151 mg/dL
Hgb A1c MFr Bld: 6.9 % — ABNORMAL HIGH (ref 4.8–5.6)

## 2015-04-28 ENCOUNTER — Other Ambulatory Visit: Payer: Self-pay

## 2015-04-28 MED ORDER — GLIMEPIRIDE 1 MG PO TABS: 0.5000 mg | ORAL_TABLET | Freq: Every day | ORAL | Status: DC

## 2015-05-05 ENCOUNTER — Other Ambulatory Visit: Payer: Self-pay | Admitting: Internal Medicine

## 2015-05-05 ENCOUNTER — Other Ambulatory Visit: Payer: Self-pay

## 2015-05-05 ENCOUNTER — Telehealth: Payer: Self-pay

## 2015-05-05 DIAGNOSIS — E1122 Type 2 diabetes mellitus with diabetic chronic kidney disease: Secondary | ICD-10-CM

## 2015-05-05 DIAGNOSIS — N183 Chronic kidney disease, stage 3 unspecified: Secondary | ICD-10-CM

## 2015-05-05 MED ORDER — GLIMEPIRIDE 1 MG PO TABS: 0.5000 mg | ORAL_TABLET | Freq: Every day | ORAL | Status: AC

## 2015-05-05 NOTE — Telephone Encounter (Signed)
Patient wants referral to Dr. Gabriel Carina for diabetes.

## 2015-05-06 DIAGNOSIS — H90A11 Conductive hearing loss, unilateral, right ear with restricted hearing on the contralateral side: Secondary | ICD-10-CM | POA: Diagnosis not present

## 2015-05-06 DIAGNOSIS — H6123 Impacted cerumen, bilateral: Secondary | ICD-10-CM | POA: Diagnosis not present

## 2015-06-02 ENCOUNTER — Encounter: Payer: Self-pay | Admitting: Internal Medicine

## 2015-06-02 ENCOUNTER — Ambulatory Visit (INDEPENDENT_AMBULATORY_CARE_PROVIDER_SITE_OTHER): Payer: Medicare Other | Admitting: Internal Medicine

## 2015-06-02 VITALS — BP 162/58 | HR 66 | Temp 98.7°F | Ht 68.5 in | Wt 174.8 lb

## 2015-06-02 DIAGNOSIS — J069 Acute upper respiratory infection, unspecified: Secondary | ICD-10-CM

## 2015-06-02 MED ORDER — FLUTICASONE PROPIONATE 50 MCG/ACT NA SUSP: 2.0000 | Freq: Every day | NASAL | Status: AC

## 2015-06-02 NOTE — Progress Notes (Signed)
Date:  06/02/2015   Name:  Shawn Mendez   DOB:  [birth year]   MRN:  [id number]   Chief Complaint: Cough Cough This is a new problem. The current episode started 1 to 4 weeks ago. The problem has been gradually improving. The problem occurs every few hours. The cough is non-productive. Associated symptoms include postnasal drip and rhinorrhea. Pertinent negatives include no chest pain, chills, ear congestion, ear pain, fever, sore throat, shortness of breath or wheezing.  Cough improved with Mucinex. Now having postnasal drip, sneezing, watery eyes, and runny nose. The nasal discharge is clear. He has no fever or chills.     Review of Systems  Constitutional: Positive for fatigue. Negative for fever and chills.  HENT: Positive for postnasal drip, rhinorrhea and sneezing. Negative for ear pain, hearing loss and sore throat.   Eyes: Negative for visual disturbance.  Respiratory: Positive for cough. Negative for choking, chest tightness, shortness of breath and wheezing.   Cardiovascular: Negative for chest pain.  Gastrointestinal: Negative for vomiting and diarrhea.    Patient Active Problem List   Diagnosis Date Noted  . Controlled type 2 diabetes mellitus with stage 3 chronic kidney disease, without long-term current use of insulin ([place]) 04/25/2015  . Chronic systolic heart failure ([place]) 03/27/2015  . Incontinence 12/19/2014  . Ankle edema 12/11/2014  . LBP (low back pain) 12/03/2014  . Cervical pain 12/03/2014  . Carpal tunnel syndrome 10/31/2014  . Chronic kidney disease, stage IV (severe) ([place]) 10/25/2014  . Calcification of coronary artery 10/25/2014  . Essential (primary) hypertension 10/25/2014  . Mixed hyperlipidemia due to type 2 diabetes mellitus ([place]) 10/25/2014  . CA of prostate ([place]) 10/25/2014  . Gastroduodenal ulcer 10/25/2014  . Purpura senilis ([place]) 10/25/2014  . Atherosclerosis of coronary artery 10/25/2014  . Diverticulitis of colon 10/25/2014  .  Non-thrombocytopenic purpura ([place]) 10/25/2014  . Abnormal CAT scan 09/17/2013  . Abnormal finding on radiology exam 09/17/2013    Prior to Admission medications   Medication Sig Start Date End Date Taking? Authorizing Provider  Cholecalciferol (VITAMIN D3) 1000 UNITS CAPS Take by mouth.   Yes Historical Provider, MD  Coenzyme Q10 (COQ10) 100 MG CAPS Take by mouth.   Yes Historical Provider, MD  folic acid (FOLVITE) Q000111Q MCG tablet Take 800 mcg by mouth daily.   Yes Historical Provider, MD  furosemide (LASIX) 20 MG tablet Take 1 tablet (20 mg total) by mouth daily. 04/25/15 04/24/16 Yes Glean Hess, MD  glimepiride (AMARYL) 1 MG tablet Take 0.5 tablets (0.5 mg total) by mouth daily. 05/05/15  Yes Glean Hess, MD  Glucose Blood (BLOOD GLUCOSE TEST VI) 1 each by In Vitro route 2 (two) times daily. 12/24/13  Yes Historical Provider, MD  glucose blood test strip 1 each 2 (two) times daily. 12/24/13  Yes Historical Provider, MD  losartan-hydrochlorothiazide (HYZAAR) 100-25 MG per tablet Take 1 tablet by mouth at bedtime.   Yes Historical Provider, MD  Multiple Vitamins-Iron (MULTIVITAMINS WITH IRON) TABS tablet Take by mouth.   Yes Historical Provider, MD  pravastatin (PRAVACHOL) 20 MG tablet Take 20 mg by mouth at bedtime.   Yes Historical Provider, MD  Psyllium (METAMUCIL MULTIHEALTH FIBER) 58.12 % PACK Take 1 packet by mouth daily.   Yes Historical Provider, MD  Specialty Vitamins Products (MAGNESIUM, AMINO ACID CHELATE,) 133 MG tablet Take 1 tablet by mouth daily.   Yes Historical Provider, MD  vitamin B-12 (CYANOCOBALAMIN) 1000 MCG tablet Take 1,000 mcg by mouth daily.  Yes Historical Provider, MD  gabapentin (NEURONTIN) 100 MG capsule Take by mouth. Reported on 06/02/2015    Historical Provider, MD    No Known Allergies  Past Surgical History  Procedure Laterality Date  . Back surgery  1972    Fusion  . Hernia repair Bilateral 1980    inguinal hernia repair  . Prostatectomy  1995    . Joint replacement Right     total knee replacement  . Right total knee replacement    . Hammer toe surgery Right   . Septoplasty    . Eye surgery Bilateral     cataract extraction  . Cholecystectomy  2000  . Carpal tunnel release Bilateral 10/23/2014    Procedure: CARPAL TUNNEL RELEASE;  Surgeon: Leanor Kail, MD;  Location: [facility] ORS;  Service: Orthopedics;  Laterality: Bilateral;    Social History  Substance Use Topics  . Smoking status: Former Smoker -- 1.00 packs/day    Types: Cigarettes    Quit date: 10/17/1978  . Smokeless tobacco: Never Used  . Alcohol Use: No     Medication list has been reviewed and updated.   Physical Exam  Constitutional: He appears well-developed and well-nourished.  HENT:  Head: Normocephalic.  Right Ear: Tympanic membrane normal.  Left Ear: Tympanic membrane and ear canal normal.  Nose: Nose normal. Right sinus exhibits no maxillary sinus tenderness and no frontal sinus tenderness. Left sinus exhibits no maxillary sinus tenderness and no frontal sinus tenderness.  Mouth/Throat: Uvula is midline. Posterior oropharyngeal erythema present. No oropharyngeal exudate or posterior oropharyngeal edema.  Cardiovascular: Normal rate, regular rhythm and normal heart sounds.   Pulmonary/Chest: Effort normal. He has no decreased breath sounds. He has no wheezes.  Nursing note and vitals reviewed.   BP 162/58 mmHg  Pulse 66  Temp(Src) 98.7 F (37.1 C) (Oral)  Ht 5' 8.5" (1.74 m)  Wt 174 lb 12.8 oz (79.289 kg)  BMI 26.19 kg/m2  SpO2 97%  Assessment and Plan: 1. Viral URI Allegra or Claritin daily as needed No indication for antibiotics at this time - fluticasone (FLONASE) 50 MCG/ACT nasal spray; Place 2 sprays into both nostrils daily.  Dispense: 16 g; Refill: [place], MD [facility]  06/02/2015

## 2015-06-02 NOTE — Patient Instructions (Signed)
Use Allegra 180 mg OR Claritin 10 mg once a day for runny nose, sneezing and watery eyes.

## 2015-06-17 ENCOUNTER — Other Ambulatory Visit: Payer: Medicare Other

## 2015-06-17 DIAGNOSIS — R040 Epistaxis: Secondary | ICD-10-CM | POA: Insufficient documentation

## 2015-06-17 DIAGNOSIS — Z7951 Long term (current) use of inhaled steroids: Secondary | ICD-10-CM | POA: Insufficient documentation

## 2015-06-17 DIAGNOSIS — E1169 Type 2 diabetes mellitus with other specified complication: Secondary | ICD-10-CM | POA: Diagnosis not present

## 2015-06-17 DIAGNOSIS — E782 Mixed hyperlipidemia: Secondary | ICD-10-CM | POA: Insufficient documentation

## 2015-06-17 DIAGNOSIS — Z7984 Long term (current) use of oral hypoglycemic drugs: Secondary | ICD-10-CM | POA: Diagnosis not present

## 2015-06-17 DIAGNOSIS — N184 Chronic kidney disease, stage 4 (severe): Secondary | ICD-10-CM | POA: Insufficient documentation

## 2015-06-17 DIAGNOSIS — Z87891 Personal history of nicotine dependence: Secondary | ICD-10-CM | POA: Insufficient documentation

## 2015-06-17 DIAGNOSIS — Z792 Long term (current) use of antibiotics: Secondary | ICD-10-CM | POA: Diagnosis not present

## 2015-06-17 DIAGNOSIS — R972 Elevated prostate specific antigen [PSA]: Secondary | ICD-10-CM

## 2015-06-17 DIAGNOSIS — Z79899 Other long term (current) drug therapy: Secondary | ICD-10-CM | POA: Insufficient documentation

## 2015-06-17 DIAGNOSIS — E1122 Type 2 diabetes mellitus with diabetic chronic kidney disease: Secondary | ICD-10-CM | POA: Diagnosis not present

## 2015-06-17 DIAGNOSIS — I129 Hypertensive chronic kidney disease with stage 1 through stage 4 chronic kidney disease, or unspecified chronic kidney disease: Secondary | ICD-10-CM | POA: Insufficient documentation

## 2015-06-17 NOTE — ED Notes (Signed)
Pt in with co nosebleed to left nare x 1 hr states started after blowing his nose.  Pt is not on blood thinners at this time, no hx of the same.  Nose clamp in place at this time.

## 2015-06-18 ENCOUNTER — Emergency Department
Admission: EM | Admit: 2015-06-18 | Discharge: 2015-06-18 | Disposition: A | Discharge status: Home / Self Care | Payer: Medicare Other | Attending: Emergency Medicine | Admitting: Emergency Medicine

## 2015-06-18 DIAGNOSIS — R040 Epistaxis: Secondary | ICD-10-CM

## 2015-06-18 LAB — CBC
HEMATOCRIT: 28 % — AB (ref 40.0–52.0)
Hemoglobin: 9.4 g/dL — ABNORMAL LOW (ref 13.0–18.0)
MCH: 31.1 pg (ref 26.0–34.0)
MCHC: 33.5 g/dL (ref 32.0–36.0)
MCV: 93 fL (ref 80.0–100.0)
PLATELETS: 221 10*3/uL (ref 150–440)
RBC: 3.02 MIL/uL — ABNORMAL LOW (ref 4.40–5.90)
RDW: 14.4 % (ref 11.5–14.5)
WBC: 6.8 10*3/uL (ref 3.8–10.6)

## 2015-06-18 LAB — PSA

## 2015-06-18 MED ORDER — LIDOCAINE VISCOUS 2 % MT SOLN
OROMUCOSAL | Status: AC
  Filled 2015-06-18: qty 15

## 2015-06-18 MED ORDER — LIDOCAINE HCL 2 % EX GEL
CUTANEOUS | Status: AC
  Filled 2015-06-18: qty 10

## 2015-06-18 MED ORDER — OXYMETAZOLINE HCL 0.05 % NA SOLN
NASAL | Status: AC
  Filled 2015-06-18: qty 15

## 2015-06-18 MED ORDER — CEPHALEXIN 250 MG PO CAPS: 250.0000 mg | ORAL_CAPSULE | Freq: Four times a day (QID) | ORAL | Status: AC

## 2015-06-18 NOTE — ED Notes (Signed)
Pt discharged to home.  Family member driving.  Discharge instructions reviewed.  Verbalized understanding.  No questions or concerns at this time.  Teach back verified.  Pt in NAD.  No items left in ED.   

## 2015-06-18 NOTE — Discharge Instructions (Signed)

## 2015-06-18 NOTE — ED Notes (Signed)
Pt had nose start to bleed again.  Dr Dahlia Client notified.  Pt's nose packed by MD.

## 2015-06-18 NOTE — ED Provider Notes (Signed)
[facility] Emergency Department Provider Note  ____________________________________________  Time seen: Approximately 0038 AM  I have reviewed the triage vital signs and the nursing notes.   HISTORY  Chief Complaint Epistaxis    HPI Shawn Mendez is a 82 y.o. male who comes into the hospital today with a nosebleed. The patient reports that it started 2 hours prior to him coming to the hospital. He reports that it has not stopped. The patient was washing his face and getting ready for bed when he blew his nose and the nosebleed started. He reports it is bleeding from the left side of his nose. He pinched his nose and stuck paper in it but it did not help. He started coughing up blood and spitting up clots that he decided to come in to get evaluated. The patient has never had a nosebleed before. He takes aspirin 81 mg but does not take it every day. The patient has had some increased bruising to his arms. He assumed it was due to the medication that he was taking. The patient has a nasal clamp on his nose and is here for evaluation and treatment.   Past Medical History  Diagnosis Date  . Hypertension   . Diabetes mellitus without complication ([place])   . Chronic kidney disease     Stage 3 renal failure  . GERD (gastroesophageal reflux disease)   . History of hiatal hernia   . Arthritis   . Cancer [facility])     prostate  . Anemia   . Diverticulosis   . COPD (chronic obstructive pulmonary disease) ([place])     previous smoker  . Hyperlipidemia   . CHF (congestive heart failure) ([place])     [facility]  . HLD (hyperlipidemia)   . Incontinence   . Urinary frequency   . Elevated PSA   . Incomplete bladder emptying     Patient Active Problem List   Diagnosis Date Noted  . Controlled type 2 diabetes mellitus with stage 3 chronic kidney disease, without long-term current use of insulin ([place]) 04/25/2015  . Chronic systolic heart failure ([place]) 03/27/2015   . Incontinence 12/19/2014  . Ankle edema 12/11/2014  . LBP (low back pain) 12/03/2014  . Cervical pain 12/03/2014  . Carpal tunnel syndrome 10/31/2014  . Chronic kidney disease, stage IV (severe) ([place]) 10/25/2014  . Calcification of coronary artery 10/25/2014  . Essential (primary) hypertension 10/25/2014  . Mixed hyperlipidemia due to type 2 diabetes mellitus ([place]) 10/25/2014  . CA of prostate ([place]) 10/25/2014  . Gastroduodenal ulcer 10/25/2014  . Purpura senilis ([place]) 10/25/2014  . Atherosclerosis of coronary artery 10/25/2014  . Diverticulitis of colon 10/25/2014  . Non-thrombocytopenic purpura ([place]) 10/25/2014  . Abnormal CAT scan 09/17/2013  . Abnormal finding on radiology exam 09/17/2013    Past Surgical History  Procedure Laterality Date  . Back surgery  1972    Fusion  . Hernia repair Bilateral 1980    inguinal hernia repair  . Prostatectomy  1995  . Joint replacement Right     total knee replacement  . Right total knee replacement    . Hammer toe surgery Right   . Septoplasty    . Eye surgery Bilateral     cataract extraction  . Cholecystectomy  2000  . Carpal tunnel release Bilateral 10/23/2014    Procedure: CARPAL TUNNEL RELEASE;  Surgeon: Leanor Kail, MD;  Location: [facility] ORS;  Service: Orthopedics;  Laterality: Bilateral;    Current Outpatient Rx  Name  Route  Sig  Dispense  Refill  . cephALEXin (KEFLEX) 250 MG capsule   Oral   Take 1 capsule (250 mg total) by mouth 4 (four) times daily.   20 capsule   0   . Cholecalciferol (VITAMIN D3) 1000 UNITS CAPS   Oral   Take by mouth.         . Coenzyme Q10 (COQ10) 100 MG CAPS   Oral   Take by mouth.         . fluticasone (FLONASE) 50 MCG/ACT nasal spray   Each Nare   Place 2 sprays into both nostrils daily.   16 g   6   . folic acid (FOLVITE) Q000111Q MCG tablet   Oral   Take 800 mcg by mouth daily.         . furosemide (LASIX) 20 MG tablet   Oral   Take 1 tablet (20 mg total) by mouth  daily.   90 tablet   1   . gabapentin (NEURONTIN) 100 MG capsule   Oral   Take by mouth. Reported on 06/02/2015         . glimepiride (AMARYL) 1 MG tablet   Oral   Take 0.5 tablets (0.5 mg total) by mouth daily.   45 tablet   2   . Glucose Blood (BLOOD GLUCOSE TEST VI)   In Vitro   1 each by In Vitro route 2 (two) times daily.         Marland Kitchen glucose blood test strip      1 each 2 (two) times daily.         Marland Kitchen losartan-hydrochlorothiazide (HYZAAR) 100-25 MG per tablet   Oral   Take 1 tablet by mouth at bedtime.         . Multiple Vitamins-Iron (MULTIVITAMINS WITH IRON) TABS tablet   Oral   Take by mouth.         . pravastatin (PRAVACHOL) 20 MG tablet   Oral   Take 20 mg by mouth at bedtime.         . Psyllium (METAMUCIL MULTIHEALTH FIBER) 58.12 % PACK   Oral   Take 1 packet by mouth daily.         Marland Kitchen Specialty Vitamins Products (MAGNESIUM, AMINO ACID CHELATE,) 133 MG tablet   Oral   Take 1 tablet by mouth daily.         . vitamin B-12 (CYANOCOBALAMIN) 1000 MCG tablet   Oral   Take 1,000 mcg by mouth daily.           Allergies Review of patient's allergies indicates no known allergies.  Family History  Problem Relation Age of Onset  . Hypertension Mother   . Congestive Heart Failure Mother   . Heart attack Father   . Breast cancer Sister   . Prostate cancer Maternal Grandfather   . Diabetes type II    . Coronary artery disease Father     Social History Social History  Substance Use Topics  . Smoking status: Former Smoker -- 1.00 packs/day    Types: Cigarettes    Quit date: 10/17/1978  . Smokeless tobacco: Never Used  . Alcohol Use: No    Review of Systems Constitutional: No fever/chills Eyes: No visual changes. ENT: Nose bleed Cardiovascular: Denies chest pain. Respiratory: Denies shortness of breath. Gastrointestinal: No abdominal pain.  No nausea, no vomiting.  No diarrhea.  No constipation. Genitourinary: Negative for  dysuria. Musculoskeletal: Negative for back pain. Skin:  Negative for rash. Neurological: Negative for headaches, focal weakness or numbness.  10-point ROS otherwise negative.  ____________________________________________   PHYSICAL EXAM:  VITAL SIGNS: ED Triage Vitals  Enc Vitals Group     BP 06/17/15 2325 137/58 mmHg     Pulse Rate 06/17/15 2325 55     Resp 06/17/15 2325 18     Temp 06/17/15 2325 96.3 F (35.7 C)     Temp Source 06/17/15 2325 Oral     SpO2 06/17/15 2325 100 %     Weight 06/17/15 2325 165 lb (74.844 kg)     Height 06/17/15 2325 5\' 8"  (1.727 m)     Head Cir --      Peak Flow --      Pain Score --      Pain Loc --      Pain Edu? --      Excl. in [place]? --     Constitutional: Alert and oriented. Well appearing and in mild distress. Eyes: Conjunctivae are normal. PERRL. EOMI. Head: Atraumatic. Nose: No active bleeding from left knee area at this time Mouth/Throat: Mucous membranes are moist.  Small amount of blood in posterior oropharynx Cardiovascular: Normal rate, regular rhythm. Grossly normal heart sounds.  Good peripheral circulation. Respiratory: Normal respiratory effort.  No retractions. Lungs CTAB. Gastrointestinal: Soft and nontender. No distention. Positive bowel sounds Musculoskeletal: No lower extremity tenderness nor edema.   Neurologic:  Normal speech and language.  Skin:  Skin is warm, dry and intact. Marland Kitchen Psychiatric: Mood and affect are normal.   ____________________________________________   LABS (all labs ordered are listed, but only abnormal results are displayed)  Labs Reviewed  CBC - Abnormal; Notable for the following:    RBC 3.02 (*)    Hemoglobin 9.4 (*)    HCT 28.0 (*)    All other components within normal limits   ____________________________________________  EKG  None ____________________________________________  RADIOLOGY  None ____________________________________________   PROCEDURES  Procedure(s) performed:  None  Critical Care performed: No  ____________________________________________   INITIAL IMPRESSION / ASSESSMENT AND PLAN / ED COURSE  Pertinent labs & imaging results that were available during my care of the patient were reviewed by me and considered in my medical decision making (see chart for details).  This is an 82 year old male who comes into the hospital today with a nosebleed. The patient did have a clamp to his nose and initially the bleeding stopped but it returned. I did spray some Afrin into the patient's nares bilaterally and replaced a clamp. I will re-assess the patient's nosebleed and determine if he needs packing at this time. I will check a CBC on the patient to ensure that he does not have any low platelets or anemia causing his symptoms.  The patient's nose began to bleed again so we decided to place packing with Murocel to his left nostril. The patient had some mild oozing around the area but no more severe bleeding. The patient will be discharged to home to follow-up with ENT. ____________________________________________   FINAL CLINICAL IMPRESSION(S) / ED DIAGNOSES  Final diagnoses:  Epistaxis      Loney Hering, MD 06/18/15 [phone]

## 2015-06-19 ENCOUNTER — Encounter: Payer: Self-pay | Admitting: Urology

## 2015-06-19 ENCOUNTER — Ambulatory Visit (INDEPENDENT_AMBULATORY_CARE_PROVIDER_SITE_OTHER): Payer: Medicare Other | Admitting: Urology

## 2015-06-19 VITALS — BP 163/66 | HR 72 | Ht 68.5 in | Wt 170.6 lb

## 2015-06-19 DIAGNOSIS — R32 Unspecified urinary incontinence: Secondary | ICD-10-CM

## 2015-06-19 DIAGNOSIS — C61 Malignant neoplasm of prostate: Secondary | ICD-10-CM | POA: Diagnosis not present

## 2015-06-19 DIAGNOSIS — Z8546 Personal history of malignant neoplasm of prostate: Secondary | ICD-10-CM

## 2015-06-19 LAB — BLADDER SCAN AMB NON-IMAGING: Scan Result: 37

## 2015-06-19 NOTE — Progress Notes (Signed)
3:55 PM   Shawn Mendez [birth year] [id number]  Referring provider: Glean Hess, MD [address] [place], [place] [address]  Chief Complaint  Patient presents with  . Prostate Cancer    follow up and lupron injection    HPI: Shawn Mendez is an 82 year old white male with prostate cancer and incontinence who presents today for one year follow up.  Prostate cancer: Patient had a RRP in 1994 for an unknown Gleason's score. His PSA has been rising and he was started on ADT therapy.  He is experiencing incontinence and ED.  He denies any weight loss, bone pain or appetite loss.  He is receiving ADT therapy in the form of 6 month Lupron injections.   He is not experiencing hot flashes. He is not experiencing pain in the injection site. He is taking his vitamin D thousand units daily and calcium in the form of a multivitamin.  Incontinence: His IPSS score today is 8, which is moderate lower urinary tract symptomatology. He is mostly satisfied with his quality life due to his urinary symptoms.  His PVR is 37 mL.  His major complaint today incontinence.  He has had these symptoms for several  years.  He feels the incontinence is minimal.  He is currently managing his incontinence with incontinence pads. He denies any dysuria, hematuria or suprapubic pain.  He also denies any recent fevers, chills, nausea or vomiting.        IPSS      06/19/15 1500       International Prostate Symptom Score   How often have you had the sensation of not emptying your bladder? Less than 1 in 5     How often have you had to urinate less than every two hours? Not at All     How often have you found you stopped and started again several times when you urinated? Less than 1 in 5 times     How often have you found it difficult to postpone urination? Not at All     How often have you had a weak urinary stream? More than half the time     How often have you had to strain to start urination?  Not at All     How many times did you typically get up at night to urinate? 2 Times     Total IPSS Score 8     Quality of Life due to urinary symptoms   If you were to spend the rest of your life with your urinary condition just the way it is now how would you feel about that? Mostly Satisfied        Score:  1-7 Mild 8-19 Moderate 20-35 Severe   PMH: Past Medical History  Diagnosis Date  . Hypertension   . Diabetes mellitus without complication ([place])   . Chronic kidney disease     Stage 3 renal failure  . GERD (gastroesophageal reflux disease)   . History of hiatal hernia   . Arthritis   . Cancer [facility])     prostate  . Anemia   . Diverticulosis   . COPD (chronic obstructive pulmonary disease) ([place])     previous smoker  . Hyperlipidemia   . CHF (congestive heart failure) ([place])     [facility]  . HLD (hyperlipidemia)   . Incontinence   . Urinary frequency   . Elevated PSA   . Incomplete bladder emptying  Surgical History: Past Surgical History  Procedure Laterality Date  . Back surgery  1972    Fusion  . Hernia repair Bilateral 1980    inguinal hernia repair  . Prostatectomy  1995  . Joint replacement Right     total knee replacement  . Right total knee replacement    . Hammer toe surgery Right   . Septoplasty    . Eye surgery Bilateral     cataract extraction  . Cholecystectomy  2000  . Carpal tunnel release Bilateral 10/23/2014    Procedure: CARPAL TUNNEL RELEASE;  Surgeon: Leanor Kail, MD;  Location: [facility] ORS;  Service: Orthopedics;  Laterality: Bilateral;    Home Medications:    Medication List       This list is accurate as of: 06/19/15  3:55 PM.  Always use your most recent med list.               BLOOD GLUCOSE TEST VI  1 each by In Vitro route 2 (two) times daily.     glucose blood test strip  1 each 2 (two) times daily.     cephALEXin 250 MG capsule  Commonly known as:  KEFLEX  Take 1 capsule (250 mg total) by mouth 4  (four) times daily.     CoQ10 100 MG Caps  Take by mouth.     fluticasone 50 MCG/ACT nasal spray  Commonly known as:  FLONASE  Place 2 sprays into both nostrils daily.     folic acid Q000111Q MCG tablet  Commonly known as:  FOLVITE  Take 800 mcg by mouth daily.     furosemide 20 MG tablet  Commonly known as:  LASIX  Take 1 tablet (20 mg total) by mouth daily.     gabapentin 100 MG capsule  Commonly known as:  NEURONTIN  Take by mouth. Reported on 06/19/2015     glimepiride 1 MG tablet  Commonly known as:  AMARYL  Take 0.5 tablets (0.5 mg total) by mouth daily.     losartan-hydrochlorothiazide 100-25 MG tablet  Commonly known as:  HYZAAR  Take 1 tablet by mouth at bedtime.     magnesium (amino acid chelate) 133 MG tablet  Take 1 tablet by mouth daily.     METAMUCIL MULTIHEALTH FIBER 58.12 % Pack  Generic drug:  Psyllium  Take 1 packet by mouth daily.     multivitamins with iron Tabs tablet  Take by mouth.     pravastatin 20 MG tablet  Commonly known as:  PRAVACHOL  Take 20 mg by mouth at bedtime.     vitamin B-12 1000 MCG tablet  Commonly known as:  CYANOCOBALAMIN  Take 1,000 mcg by mouth daily.     Vitamin D3 1000 units Caps  Take by mouth.        Allergies: No Known Allergies  Family History: Family History  Problem Relation Age of Onset  . Hypertension Mother   . Congestive Heart Failure Mother   . Heart attack Father   . Breast cancer Sister   . Prostate cancer Maternal Grandfather   . Diabetes type II    . Coronary artery disease Father   . Kidney disease Neg Hx     Social History:  reports that he quit smoking about 36 years ago. His smoking use included Cigarettes. He smoked 1.00 pack per day. He has never used smokeless tobacco. He reports that he does not drink alcohol or use illicit drugs.  ROS: UROLOGY Frequent Urination?:  Yes Hard to postpone urination?: No Burning/pain with urination?: No Get up at night to urinate?: Yes Leakage of  urine?: Yes Urine stream starts and stops?: No Trouble starting stream?: No Do you have to strain to urinate?: No Blood in urine?: No Urinary tract infection?: No Sexually transmitted disease?: No Injury to kidneys or bladder?: No Painful intercourse?: No Weak stream?: No Erection problems?: Yes Penile pain?: No  Gastrointestinal Nausea?: No Vomiting?: No Indigestion/heartburn?: No Diarrhea?: No Constipation?: No  Constitutional Fever: No Night sweats?: No Weight loss?: No Fatigue?: Yes  Skin Skin rash/lesions?: No Itching?: No  Eyes Blurred vision?: No Double vision?: No  Ears/Nose/Throat Sore throat?: No Sinus problems?: Yes  Hematologic/Lymphatic Swollen glands?: No Easy bruising?: Yes  Cardiovascular Leg swelling?: Yes Chest pain?: No  Respiratory Cough?: No Shortness of breath?: Yes  Endocrine Excessive thirst?: No  Musculoskeletal Back pain?: No Joint pain?: Yes  Neurological Headaches?: No Dizziness?: No  Psychologic Depression?: No Anxiety?: No  Physical Exam: BP 163/66 mmHg  Pulse 72  Ht 5' 8.5" (1.74 m)  Wt 170 lb 9.6 oz (77.384 kg)  BMI 25.56 kg/m2  GU: Patient with circumcised phallus.   Urethral meatus is patent.  No penile discharge. No penile lesions or rashes. Scrotum without lesions, cysts, rashes and/or edema.  Testicles are located scrotally bilaterally. No masses are appreciated in the testicles. Left and right epididymis are normal. Rectal: Patient with  normal sphincter tone. Perineum without scarring or rashes. No rectal masses are appreciated. Rectal vault is empty.    Laboratory Data: Results for orders placed or performed in visit on 06/19/15  BLADDER SCAN AMB NON-IMAGING  Result Value Ref Range   Scan Result 37     Lab Results  Component Value Date   WBC 6.8 06/18/2015   HGB 9.4* 06/18/2015   HCT 28.0* 06/18/2015   MCV 93.0 06/18/2015   PLT 221 06/18/2015    Lab Results  Component Value Date    CREATININE 1.44* 04/25/2015    Lab Results  Component Value Date   PSA 15.6 06/01/2013  PSA History:     02/26/2011- 2.57 ng/mL     11/30/2010- 6.46 ng/mL     06/16/2011- 7.39 ng/mL     01/04/2012- 8.72 ng/mL     07/04/2012- 11.09 ng/mL     08/16/2013- 20.1 ng/mL - started Firmagon     11/19/2013 - <0.1 ng/mL - changed to Lupron        06/19/2014 - <0.1 ng/mL      06/17/2015- <0.1 ng/mL   Lab Results  Component Value Date   HGBA1C 6.9* 04/25/2015     Assessment & Plan:     1.  CA of prostate:  Patient had a RRP in 1994 for an unknown Gleason's score. His PSA has been rising and he was started on ADT therapy. Patient did not receive his  Lupron injection today.  He'll like to discontinue his ADT therapy at this time. He is in the process of relocating to [place]. If he is still in [place] in 3 months he will return for a PSA drawn. If the PSA starts to rise, he will receive a Lupron injection.  2. Incontinence:   Patient does not finding the incontinence bothersome. He is currently managing it through wearing incontinence pads. He does not desire any further evaluation or workup concerning this issue at this time.  - Urinalysis, Complete - Bladder Scan (Post Void Residual) in office   Return in about 3 months (  around 09/16/2015) for PSA only.   Zara Council, [facility] [address], [place], [place] [address] [phone]

## 2015-06-20 ENCOUNTER — Ambulatory Visit: Payer: Medicare Other | Admitting: Urology

## 2015-06-21 DIAGNOSIS — C61 Malignant neoplasm of prostate: Secondary | ICD-10-CM | POA: Insufficient documentation

## 2015-07-13 ENCOUNTER — Other Ambulatory Visit: Payer: Self-pay | Admitting: Internal Medicine

## 2015-07-15 ENCOUNTER — Ambulatory Visit: Payer: Medicare Other

## 2015-07-15 ENCOUNTER — Ambulatory Visit: Payer: Medicare Other | Admitting: Urology

## 2015-07-15 DIAGNOSIS — C61 Malignant neoplasm of prostate: Secondary | ICD-10-CM

## 2015-07-15 MED ORDER — LEUPROLIDE ACETATE (6 MONTH) 45 MG IM KIT
45.0000 mg | PACK | Freq: Once | INTRAMUSCULAR | Status: AC
  Administered 2015-07-15: 45 mg via INTRAMUSCULAR

## 2015-07-15 NOTE — Progress Notes (Unsigned)
Lupron IM Injection   Due to Prostate Cancer patient is present today for a Lupron Injection.  Medication: Lupron 6 month Dose: 45 mg  Location: left upper outer buttocks Lot: [id number] Exp: 10/02/2016  Patient tolerated well, no complications were noted  Performed by: Toniann Fail, LPN

## 2015-09-15 ENCOUNTER — Other Ambulatory Visit: Payer: Self-pay

## 2015-09-15 DIAGNOSIS — C61 Malignant neoplasm of prostate: Secondary | ICD-10-CM

## 2015-09-16 ENCOUNTER — Encounter: Payer: Self-pay | Admitting: Urology

## 2015-09-16 ENCOUNTER — Other Ambulatory Visit: Payer: Medicare Other

## 2015-10-24 ENCOUNTER — Ambulatory Visit: Payer: Medicare Other | Admitting: Internal Medicine

## 2015-11-21 ENCOUNTER — Other Ambulatory Visit: Payer: Self-pay | Admitting: Internal Medicine

## 2016-02-19 ENCOUNTER — Other Ambulatory Visit: Payer: Self-pay | Admitting: Internal Medicine

## 2016-03-15 ENCOUNTER — Other Ambulatory Visit: Payer: Self-pay | Admitting: Internal Medicine

## 2016-04-08 ENCOUNTER — Other Ambulatory Visit: Payer: Self-pay | Admitting: Internal Medicine

## 2016-07-19 ENCOUNTER — Other Ambulatory Visit: Payer: Self-pay

## 2017-07-09 IMAGING — CT CT HEAD W/O CM
2 series · 16 of 30 positions shown, 20 images · non-contrast
Comparison: None.

CLINICAL DATA: Sharp right-sided headache postauricular and right
neck pain. Also right eye

Pain.
EXAM:
CT HEAD WITHOUT CONTRAST
TECHNIQUE: Contiguous axial images were obtained from the base of the skull
through the vertex without intravenous contrast.

[Series 4: soft tissue recon · axial · 0.42mm/px · z∈[+667,+782]mm · 13 of 30 slices shown, 17 images]
[im 3/30  brain]
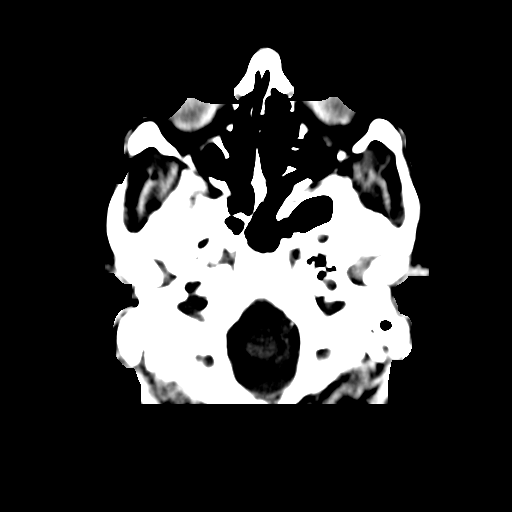
[im 3/30  bone]
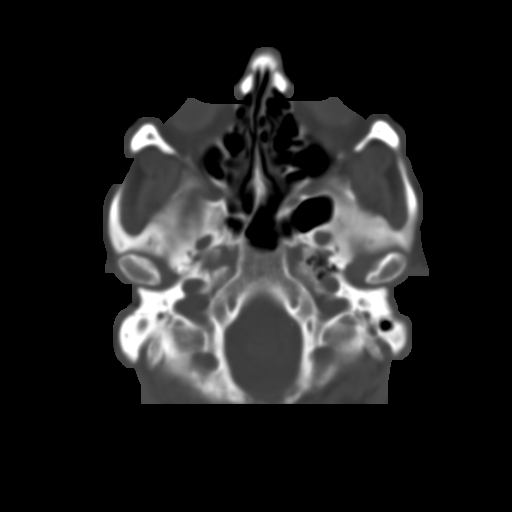
[im 5/30  brain]
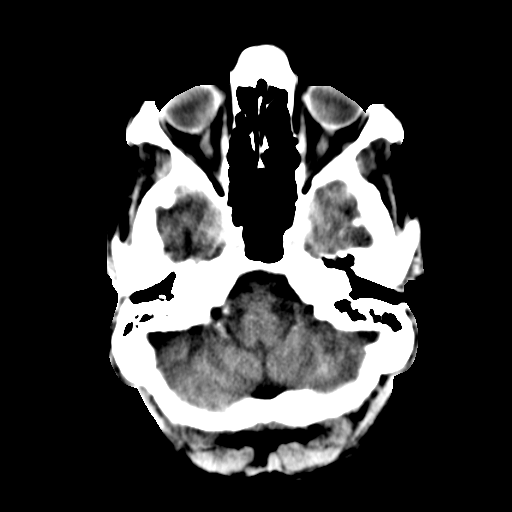
[im 7/30  brain]
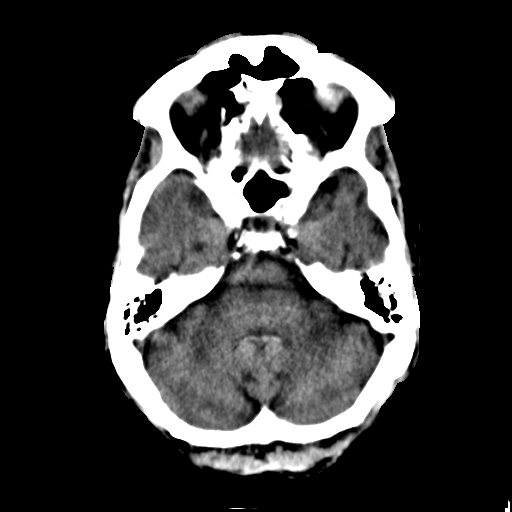
[im 9/30  brain]
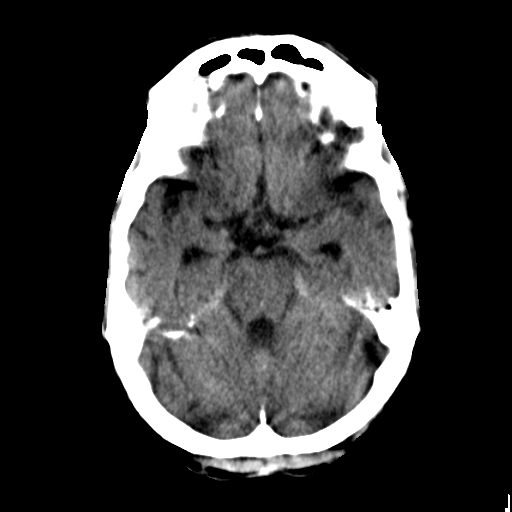
[im 11/30  brain]
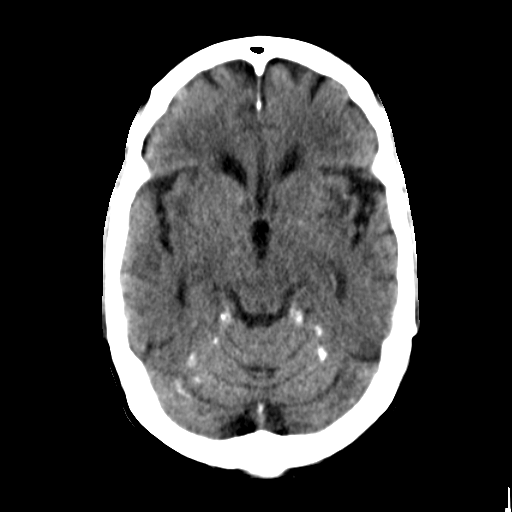
[im 11/30  bone]
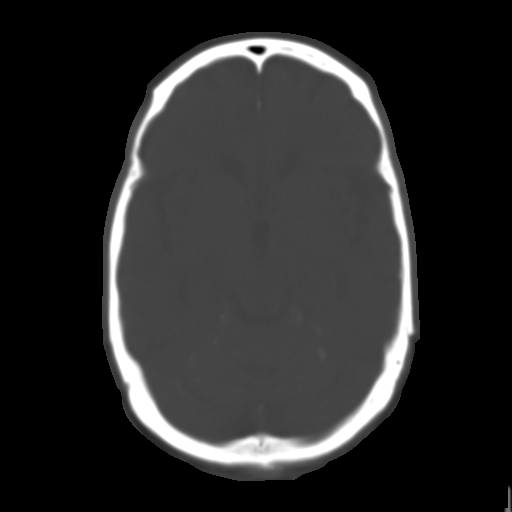
[im 13/30  brain]
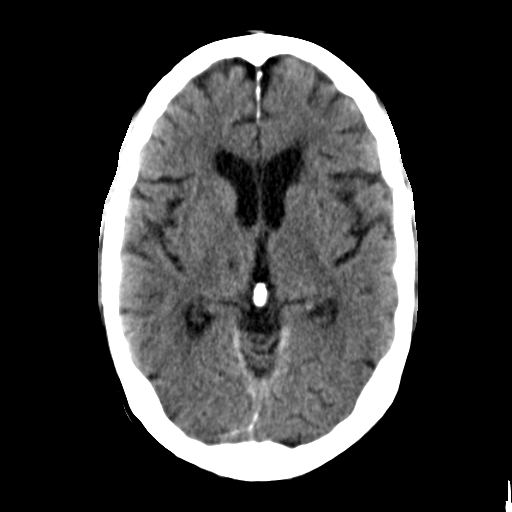
[im 15/30  brain]
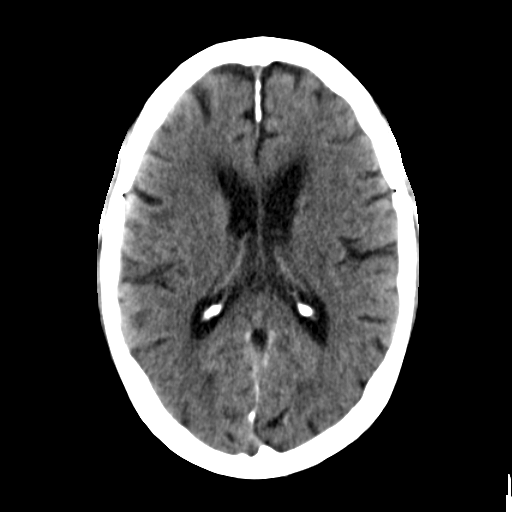
[im 17/30  brain]
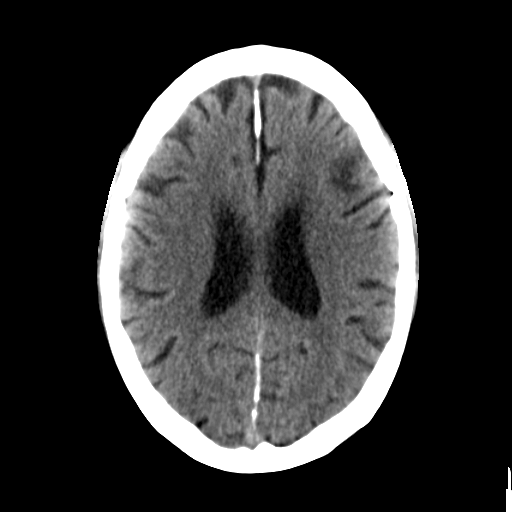
[im 19/30  brain]
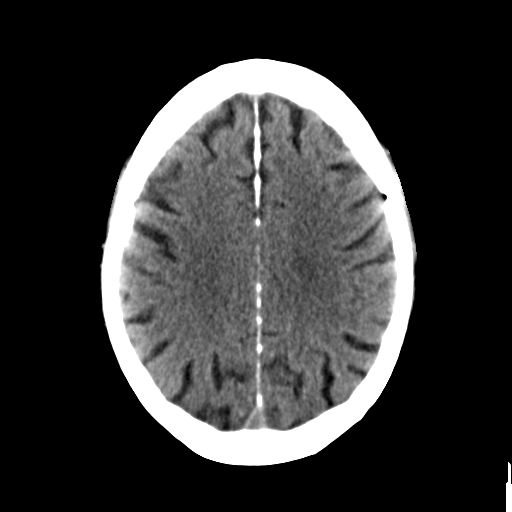
[im 19/30  bone]
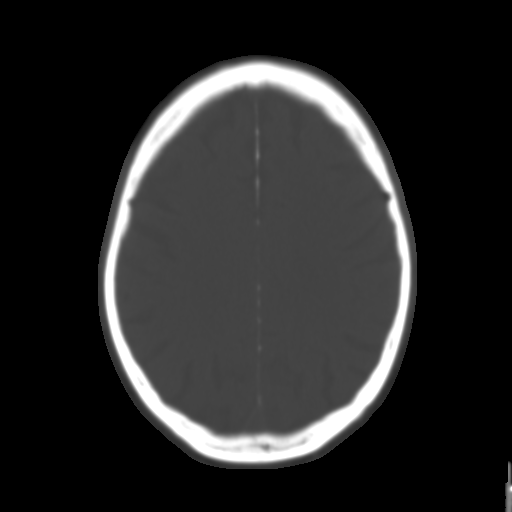
[im 21/30  brain]
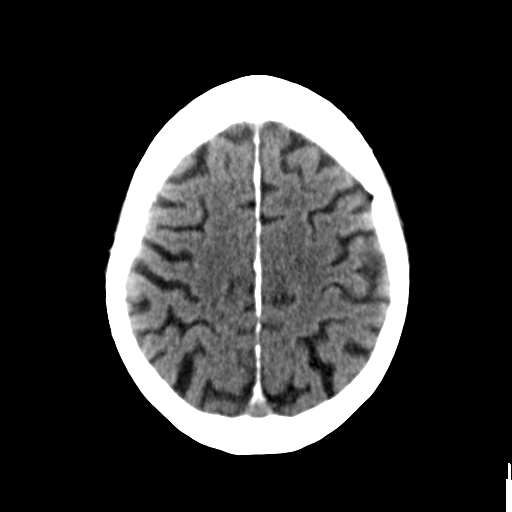
[im 23/30  brain]
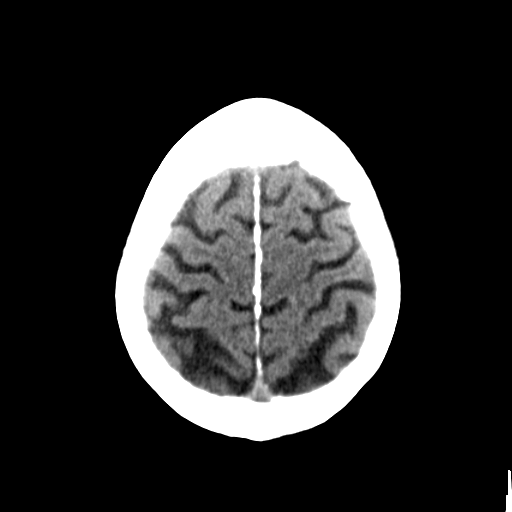
[im 25/30  brain]
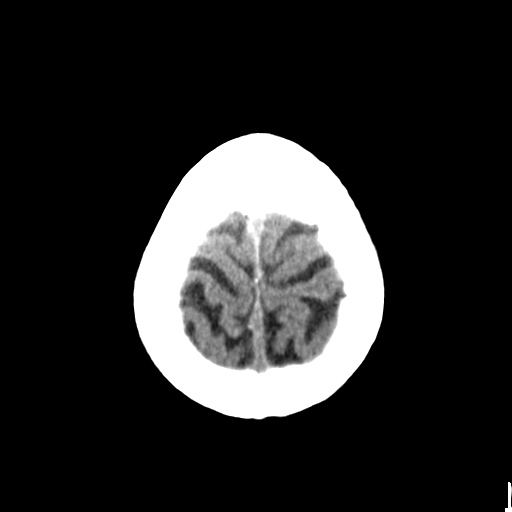
[im 27/30  brain]
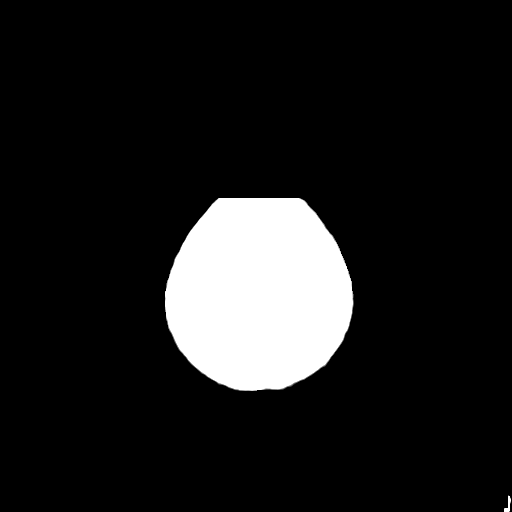
[im 27/30  bone]
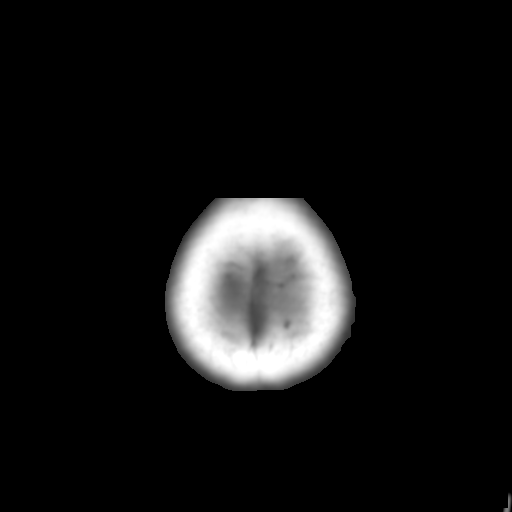

[Series 5: bone recon · axial · 0.42mm/px · z∈[+667,+705]mm · 3 of 30 slices shown]
[im 3/30  bone]
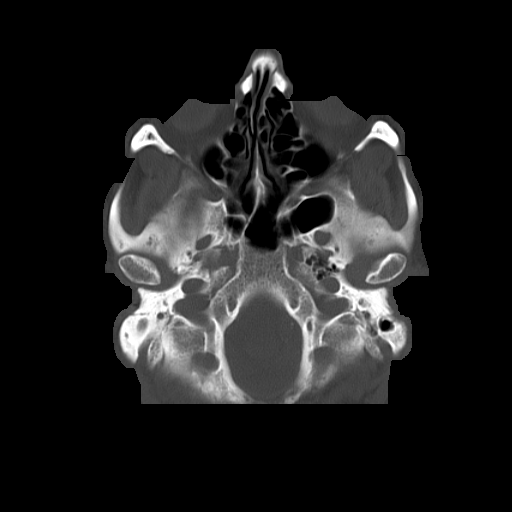
[im 7/30  bone]
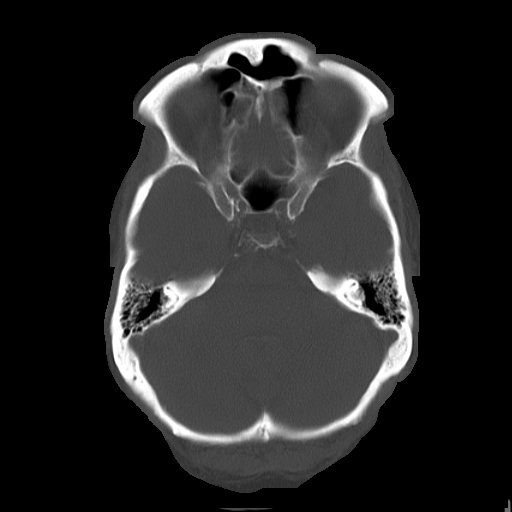
[im 11/30  bone]
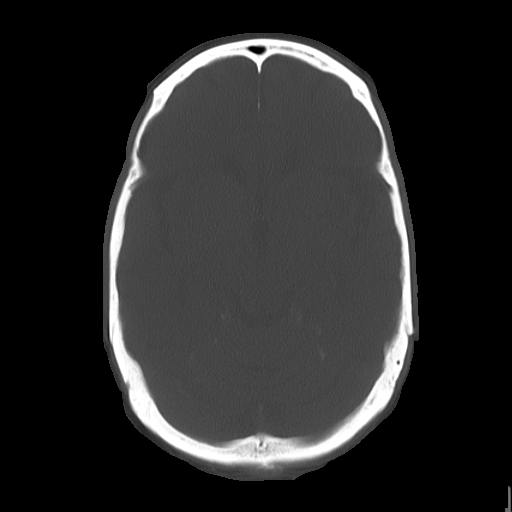

[16 of 30 positions shown; findings below may reference images not displayed]

FINDINGS: The ventricles, cisterns and other CSF spaces are within normal.
There is minimal chronic ischemic microvascular disease. There is an
old lacunar infarct over the right thalamus. Possible old lacunar
infarct over the left lentiform nucleus. No mass, mass effect, shift
of midline structures or acute hemorrhage. No evidence of acute
infarction. Remaining bones and soft tissues are unremarkable.
IMPRESSION: No acute intracranial findings.

Minimal chronic ischemic microvascular disease and small old lacunar
infarcts as described.

## 2018-07-16 DEATH — deceased
